# Patient Record
Sex: Male | Born: 1950 | Race: White | Hispanic: No | Marital: Married | Smoking: Former smoker
Health system: Southern US, Community
[De-identification: ages and names within clinical notes are randomized; demographics above are authoritative.]

## PROBLEM LIST (undated history)

## (undated) DIAGNOSIS — R9389 Abnormal findings on diagnostic imaging of other specified body structures: Secondary | ICD-10-CM

## (undated) DIAGNOSIS — C801 Malignant (primary) neoplasm, unspecified: Secondary | ICD-10-CM

## (undated) DIAGNOSIS — Z860100 Personal history of colon polyps, unspecified: Secondary | ICD-10-CM

## (undated) DIAGNOSIS — E785 Hyperlipidemia, unspecified: Secondary | ICD-10-CM

## (undated) DIAGNOSIS — K219 Gastro-esophageal reflux disease without esophagitis: Secondary | ICD-10-CM

## (undated) DIAGNOSIS — M1990 Unspecified osteoarthritis, unspecified site: Secondary | ICD-10-CM

## (undated) DIAGNOSIS — D329 Benign neoplasm of meninges, unspecified: Secondary | ICD-10-CM

## (undated) DIAGNOSIS — R972 Elevated prostate specific antigen [PSA]: Secondary | ICD-10-CM

## (undated) DIAGNOSIS — K5730 Diverticulosis of large intestine without perforation or abscess without bleeding: Secondary | ICD-10-CM

## (undated) DIAGNOSIS — R0789 Other chest pain: Secondary | ICD-10-CM

## (undated) DIAGNOSIS — J329 Chronic sinusitis, unspecified: Secondary | ICD-10-CM

## (undated) HISTORY — DX: Diverticulosis of large intestine without perforation or abscess without bleeding: K57.30

## (undated) HISTORY — DX: Unspecified osteoarthritis, unspecified site: M19.90

## (undated) HISTORY — DX: Chronic sinusitis, unspecified: J32.9

## (undated) HISTORY — DX: Hyperlipidemia, unspecified: E78.5

## (undated) HISTORY — DX: Abnormal findings on diagnostic imaging of other specified body structures: R93.89

## (undated) HISTORY — DX: Benign neoplasm of meninges, unspecified: D32.9

## (undated) HISTORY — DX: Gastro-esophageal reflux disease without esophagitis: K21.9

## (undated) HISTORY — DX: Malignant (primary) neoplasm, unspecified: C80.1

## (undated) HISTORY — PX: POLYPECTOMY: SHX149

## (undated) HISTORY — DX: Personal history of colon polyps, unspecified: Z86.0100

## (undated) HISTORY — DX: Other chest pain: R07.89

## (undated) HISTORY — DX: Elevated prostate specific antigen (PSA): R97.20

## (undated) HISTORY — PX: COLONOSCOPY: SHX174

## (undated) HISTORY — PX: TONSILLECTOMY AND ADENOIDECTOMY: SUR1326

## (undated) HISTORY — PX: REFRACTIVE SURGERY: SHX103

## (undated) HISTORY — DX: Personal history of colonic polyps: Z86.010

---

## 2000-07-29 ENCOUNTER — Encounter: Admission: RE | Admit: 2000-07-29 | Discharge: 2000-07-29 | Payer: Self-pay | Admitting: Sports Medicine

## 2000-07-29 ENCOUNTER — Encounter: Payer: Self-pay | Admitting: Sports Medicine

## 2000-09-16 ENCOUNTER — Encounter: Admission: RE | Admit: 2000-09-16 | Discharge: 2000-09-16 | Payer: Self-pay | Admitting: Family Medicine

## 2001-10-21 ENCOUNTER — Encounter: Admission: RE | Admit: 2001-10-21 | Discharge: 2001-10-21 | Payer: Self-pay | Admitting: Sports Medicine

## 2001-11-05 HISTORY — PX: OTHER SURGICAL HISTORY: SHX169

## 2001-12-25 ENCOUNTER — Encounter (INDEPENDENT_AMBULATORY_CARE_PROVIDER_SITE_OTHER): Payer: Self-pay

## 2001-12-25 ENCOUNTER — Inpatient Hospital Stay (HOSPITAL_COMMUNITY): Admission: RE | Admit: 2001-12-25 | Discharge: 2001-12-28 | Payer: Self-pay | Admitting: Orthopaedic Surgery

## 2001-12-25 ENCOUNTER — Encounter: Payer: Self-pay | Admitting: Orthopaedic Surgery

## 2003-05-19 ENCOUNTER — Encounter: Payer: Self-pay | Admitting: Internal Medicine

## 2003-05-19 DIAGNOSIS — Z860100 Personal history of colon polyps, unspecified: Secondary | ICD-10-CM | POA: Insufficient documentation

## 2003-10-05 ENCOUNTER — Encounter: Admission: RE | Admit: 2003-10-05 | Discharge: 2003-10-05 | Payer: Self-pay | Admitting: Sports Medicine

## 2003-11-02 ENCOUNTER — Encounter: Admission: RE | Admit: 2003-11-02 | Discharge: 2003-11-02 | Payer: Self-pay | Admitting: Sports Medicine

## 2004-08-29 ENCOUNTER — Ambulatory Visit: Payer: Self-pay | Admitting: Sports Medicine

## 2005-05-29 ENCOUNTER — Ambulatory Visit: Payer: Self-pay | Admitting: Sports Medicine

## 2005-10-09 ENCOUNTER — Ambulatory Visit: Payer: Self-pay | Admitting: Internal Medicine

## 2005-10-09 ENCOUNTER — Ambulatory Visit: Payer: Self-pay | Admitting: Pulmonary Disease

## 2005-10-18 ENCOUNTER — Ambulatory Visit: Payer: Self-pay | Admitting: Internal Medicine

## 2006-10-15 ENCOUNTER — Ambulatory Visit: Payer: Self-pay | Admitting: Pulmonary Disease

## 2008-01-07 ENCOUNTER — Telehealth (INDEPENDENT_AMBULATORY_CARE_PROVIDER_SITE_OTHER): Payer: Self-pay | Admitting: *Deleted

## 2008-01-15 ENCOUNTER — Encounter: Payer: Self-pay | Admitting: Pulmonary Disease

## 2008-03-24 ENCOUNTER — Ambulatory Visit: Payer: Self-pay | Admitting: Pulmonary Disease

## 2008-03-24 DIAGNOSIS — M1990 Unspecified osteoarthritis, unspecified site: Secondary | ICD-10-CM | POA: Insufficient documentation

## 2008-03-24 DIAGNOSIS — J329 Chronic sinusitis, unspecified: Secondary | ICD-10-CM | POA: Insufficient documentation

## 2008-03-24 DIAGNOSIS — R972 Elevated prostate specific antigen [PSA]: Secondary | ICD-10-CM

## 2008-03-24 DIAGNOSIS — R0789 Other chest pain: Secondary | ICD-10-CM | POA: Insufficient documentation

## 2008-03-24 DIAGNOSIS — E785 Hyperlipidemia, unspecified: Secondary | ICD-10-CM

## 2008-11-05 HISTORY — PX: OTHER SURGICAL HISTORY: SHX169

## 2009-08-18 ENCOUNTER — Ambulatory Visit: Payer: Self-pay | Admitting: Sports Medicine

## 2009-08-18 DIAGNOSIS — M25569 Pain in unspecified knee: Secondary | ICD-10-CM

## 2009-08-18 DIAGNOSIS — M7660 Achilles tendinitis, unspecified leg: Secondary | ICD-10-CM | POA: Insufficient documentation

## 2009-10-11 ENCOUNTER — Telehealth (INDEPENDENT_AMBULATORY_CARE_PROVIDER_SITE_OTHER): Payer: Self-pay | Admitting: *Deleted

## 2009-10-13 ENCOUNTER — Ambulatory Visit: Payer: Self-pay | Admitting: Pulmonary Disease

## 2009-10-13 DIAGNOSIS — D320 Benign neoplasm of cerebral meninges: Secondary | ICD-10-CM

## 2009-10-13 LAB — CONVERTED CEMR LAB
ALT: 40 units/L (ref 0–53)
Basophils Absolute: 0 10*3/uL (ref 0.0–0.1)
Bilirubin, Direct: 0 mg/dL (ref 0.0–0.3)
CO2: 28 meq/L (ref 19–32)
Chloride: 107 meq/L (ref 96–112)
Cholesterol: 170 mg/dL (ref 0–200)
Direct LDL: 76.4 mg/dL
Eosinophils Relative: 3.1 % (ref 0.0–5.0)
HDL: 30.6 mg/dL — ABNORMAL LOW (ref >39.00)
Ketones, ur: NEGATIVE mg/dL
Leukocytes, UA: NEGATIVE
MCV: 95.6 fL (ref 78.0–100.0)
Monocytes Absolute: 0.7 10*3/uL (ref 0.1–1.0)
Monocytes Relative: 10.5 % (ref 3.0–12.0)
Neutrophils Relative %: 56.3 % (ref 43.0–77.0)
Nitrite: NEGATIVE
PSA: 5.05 ng/mL — ABNORMAL HIGH (ref 0.10–4.00)
Platelets: 168 10*3/uL (ref 150.0–400.0)
RDW: 12.1 % (ref 11.5–14.6)
Sodium: 141 meq/L (ref 135–145)
Specific Gravity, Urine: >=1.03 (ref 1.000–1.030)
Total Bilirubin: 0.8 mg/dL (ref 0.3–1.2)
Total CHOL/HDL Ratio: 6
Triglycerides: 303 mg/dL — ABNORMAL HIGH (ref 0.0–149.0)
Urobilinogen, UA: 0.2 (ref 0.0–1.0)
WBC: 7.1 10*3/uL (ref 4.5–10.5)
pH: 5 (ref 5.0–8.0)

## 2009-11-05 HISTORY — PX: OTHER SURGICAL HISTORY: SHX169

## 2010-05-19 ENCOUNTER — Encounter (INDEPENDENT_AMBULATORY_CARE_PROVIDER_SITE_OTHER): Payer: Self-pay | Admitting: *Deleted

## 2010-05-19 ENCOUNTER — Telehealth (INDEPENDENT_AMBULATORY_CARE_PROVIDER_SITE_OTHER): Payer: Self-pay

## 2010-06-13 ENCOUNTER — Encounter (INDEPENDENT_AMBULATORY_CARE_PROVIDER_SITE_OTHER): Payer: Self-pay | Admitting: *Deleted

## 2010-06-15 ENCOUNTER — Ambulatory Visit: Payer: Self-pay | Admitting: Internal Medicine

## 2010-06-15 ENCOUNTER — Ambulatory Visit: Payer: Self-pay | Admitting: Sports Medicine

## 2010-07-05 ENCOUNTER — Telehealth: Payer: Self-pay | Admitting: Internal Medicine

## 2010-07-05 ENCOUNTER — Telehealth (INDEPENDENT_AMBULATORY_CARE_PROVIDER_SITE_OTHER): Payer: Self-pay | Admitting: *Deleted

## 2010-08-24 ENCOUNTER — Ambulatory Visit: Payer: Self-pay | Admitting: Internal Medicine

## 2010-08-29 ENCOUNTER — Encounter: Payer: Self-pay | Admitting: Internal Medicine

## 2010-11-07 ENCOUNTER — Encounter: Payer: Self-pay | Admitting: Internal Medicine

## 2010-11-17 ENCOUNTER — Ambulatory Visit
Admission: RE | Admit: 2010-11-17 | Discharge: 2010-11-17 | Payer: Self-pay | Source: Home / Self Care | Attending: Pulmonary Disease | Admitting: Pulmonary Disease

## 2010-11-17 ENCOUNTER — Other Ambulatory Visit: Payer: Self-pay | Admitting: Pulmonary Disease

## 2010-11-17 LAB — CBC WITH DIFFERENTIAL/PLATELET
Basophils Absolute: 0 10*3/uL (ref 0.0–0.1)
Basophils Relative: 0.5 % (ref 0.0–3.0)
Eosinophils Absolute: 0.2 10*3/uL (ref 0.0–0.7)
Eosinophils Relative: 2.2 % (ref 0.0–5.0)
HCT: 43.6 % (ref 39.0–52.0)
Hemoglobin: 15.1 g/dL (ref 13.0–17.0)
Lymphocytes Relative: 24.5 % (ref 12.0–46.0)
Lymphs Abs: 1.9 10*3/uL (ref 0.7–4.0)
MCHC: 34.8 g/dL (ref 30.0–36.0)
MCV: 94 fl (ref 78.0–100.0)
Monocytes Absolute: 0.7 10*3/uL (ref 0.1–1.0)
Monocytes Relative: 8.4 % (ref 3.0–12.0)
Neutro Abs: 5 10*3/uL (ref 1.4–7.7)
Neutrophils Relative %: 64.4 % (ref 43.0–77.0)
Platelets: 212 10*3/uL (ref 150.0–400.0)
RBC: 4.64 Mil/uL (ref 4.22–5.81)
RDW: 12.9 % (ref 11.5–14.6)
WBC: 7.8 10*3/uL (ref 4.5–10.5)

## 2010-11-17 LAB — BASIC METABOLIC PANEL
BUN: 18 mg/dL (ref 6–23)
CO2: 30 mEq/L (ref 19–32)
Calcium: 9.4 mg/dL (ref 8.4–10.5)
Chloride: 107 mEq/L (ref 96–112)
Creatinine, Ser: 1.3 mg/dL (ref 0.4–1.5)
GFR: 60.77 mL/min (ref >60.00)
Glucose, Bld: 94 mg/dL (ref 70–99)
Potassium: 4.5 mEq/L (ref 3.5–5.1)
Sodium: 143 mEq/L (ref 135–145)

## 2010-11-17 LAB — HEPATIC FUNCTION PANEL
ALT: 21 U/L (ref 0–53)
AST: 23 U/L (ref 0–37)
Albumin: 4.1 g/dL (ref 3.5–5.2)
Alkaline Phosphatase: 52 U/L (ref 39–117)
Bilirubin, Direct: 0.1 mg/dL (ref 0.0–0.3)
Total Bilirubin: 0.5 mg/dL (ref 0.3–1.2)
Total Protein: 6.8 g/dL (ref 6.0–8.3)

## 2010-11-17 LAB — LIPID PANEL
Cholesterol: 115 mg/dL (ref 0–200)
HDL: 29.3 mg/dL — ABNORMAL LOW (ref >39.00)
Total CHOL/HDL Ratio: 4
Triglycerides: 220 mg/dL — ABNORMAL HIGH (ref 0.0–149.0)
VLDL: 44 mg/dL — ABNORMAL HIGH (ref 0.0–40.0)

## 2010-11-17 LAB — TSH: TSH: 1.28 u[IU]/mL (ref 0.35–5.50)

## 2010-11-17 LAB — LDL CHOLESTEROL, DIRECT: Direct LDL: 57.3 mg/dL

## 2010-11-30 ENCOUNTER — Telehealth (INDEPENDENT_AMBULATORY_CARE_PROVIDER_SITE_OTHER): Payer: Self-pay | Admitting: *Deleted

## 2010-12-03 LAB — CONVERTED CEMR LAB
Bilirubin, Direct: 0.1 mg/dL (ref 0.0–0.3)
Calcium: 9.3 mg/dL (ref 8.4–10.5)
Cholesterol: 134 mg/dL (ref 0–200)
Crystals: NEGATIVE
GFR calc Af Amer: 80 mL/min
GFR calc non Af Amer: 66 mL/min
HCT: 45.5 % (ref 39.0–52.0)
HDL: 25.4 mg/dL — ABNORMAL LOW (ref >39.0)
Hemoglobin, Urine: NEGATIVE
Lymphocytes Relative: 24.9 % (ref 12.0–46.0)
Monocytes Absolute: 0.7 10*3/uL (ref 0.1–1.0)
Monocytes Relative: 10 % (ref 3.0–12.0)
Nitrite: NEGATIVE
Platelets: 176 10*3/uL (ref 150–400)
RDW: 12.2 % (ref 11.5–14.6)
Sodium: 142 meq/L (ref 135–145)
Squamous Epithelial / LPF: NEGATIVE /lpf
TSH: 1.2 microintl units/mL (ref 0.35–5.50)
Total Bilirubin: 1.1 mg/dL (ref 0.3–1.2)
Total CHOL/HDL Ratio: 5.3
Total Protein, Urine: NEGATIVE mg/dL
Triglycerides: 203 mg/dL (ref 0–149)
VLDL: 41 mg/dL — ABNORMAL HIGH (ref 0–40)

## 2010-12-05 NOTE — Miscellaneous (Signed)
Summary: [facility] Previsit/prep  Clinical Lists Changes  Medications: Added new medication of MOVIPREP 100 GM  SOLR (PEG-KCL-NACL-NASULF-NA ASC-C) As per prep instructions. - Signed Rx of MOVIPREP 100 GM  SOLR (PEG-KCL-NACL-NASULF-NA ASC-C) As per prep instructions.;  #1 x 0;  Signed;  Entered by: Wyona Almas RN;  Authorized by: Iva Boop MD, [facility];  Method used: Print then Give to Patient Observations: Added new observation of ALLERGY REV: Done (06/15/2010 14:11)    Prescriptions: MOVIPREP 100 GM  SOLR (PEG-KCL-NACL-NASULF-NA ASC-C) As per prep instructions.  #1 x 0   Entered by:   Wyona Almas RN   Authorized by:   Iva Boop MD, [facility]   Signed by:   Wyona Almas RN on 06/15/2010   Method used:   Print then Give to Patient   RxID:   [id number]

## 2010-12-05 NOTE — Procedures (Signed)
Summary: Colonoscopy   Colonoscopy  Procedure date:  10/28/2003  Findings:      Location:  [facility].   Patient Name: Jimmy Lamb, Jimmy Lamb. MRN:  Procedure Procedures: Colonoscopy CPT: [phone].  Personnel: Endoscopist: Iva Boop, MD, [facility].  Exam Location: Exam performed in Outpatient Clinic. Outpatient  Patient Consent: Procedure, Alternatives, Risks and Benefits discussed, consent obtained, from patient. Consent was obtained by the RN.  Indications  Surveillance of: Adenomatous Polyp(s). This is an initial surveillance exam. Initial polypectomy was performed in 2004. in Jul. 3 or more Polyps were found at Index Exam. Largest polyp removed was 6 to 9 mm. Prior polyp located in proximal (splenic flexure and beyond) colon. Pathology of worst  polyp: tubular adenoma.  History  Current Medications: Patient is not currently taking Coumadin.  Allergies: Allergic to Penicillin.  Pre-Exam Physical: Performed Oct 18, 2005. Cardio-pulmonary exam, Rectal exam, HEENT exam , Abdominal exam, Mental status exam WNL.  Comments: Patient history reviewed and updated, pre-procedure physical performed prior to initiation of sedation? Yes Exam Exam: Extent of exam reached: Cecum, extent intended: Cecum.  The cecum was identified by appendiceal orifice and IC valve. Patient position: on left side. Colon retroflexion performed. Images taken. ASA Classification: II. Tolerance: excellent.  Monitoring: Pulse and BP monitoring, Oximetry used. Supplemental O2 given.  Colon Prep Used OsmoPrep for colon prep. Prep results: good.  Sedation Meds: Patient assessed and found to be appropriate for moderate (conscious) sedation. Fentanyl 50 mcg. given IV. Versed 5 mg. given IV.  Findings - DIVERTICULOSIS: Descending Colon to Sigmoid Colon. ICD9: Diverticulosis, Colon: 562.10.  - NORMAL EXAM: Cecum to Splenic Flexure.  HEMORRHOIDS: Internal. Size: Grade I. ICD9: Hemorrhoids,  Internal: 455.0.   Assessment  Diagnoses: 562.10: Diverticulosis, Colon.  455.0: Hemorrhoids, Internal.   Comments: NO POLYPS OR CANCER SEEN Events  Unplanned Interventions: No intervention was required.  Plans Patient Education: Patient given standard instructions for: Diverticulosis. Hemorrhoids.  Disposition: After procedure patient sent to recovery. After recovery patient sent home.  Scheduling/Referral: Colonoscopy, to Iva Boop, MD, [facility], 5 years,  Primary Care Provider, to [facility]. Kriste Basque, MD, re: elevated PSA,   Comments: Will send chart back to Dr. Kriste Basque. Recent labs showing PSA of 4.22 on chart and may not have been seen yet. Prostate is normal.   This report was created from the original endoscopy report, which was reviewed and signed by the above listed endoscopist.

## 2010-12-05 NOTE — Procedures (Signed)
Summary: Colonoscopy  Patient: Jimmy Lamb Note: All result statuses are Final unless otherwise noted.  Tests: (1) Colonoscopy (COL)   COL Colonoscopy           DONE     [facility]     [address]     [place], [place]  [address]           COLONOSCOPY PROCEDURE REPORT           PATIENT:  Jimmy Lamb, Jimmy Lamb  MR#:  [id number]     BIRTHDATE:  07/01/1951, 59 yrs. old  GENDER:  male     ENDOSCOPIST:  Iva Boop, MD, [facility]           PROCEDURE DATE:  08/24/2010     PROCEDURE:  Colonoscopy with biopsy and snare polypectomy     ASA CLASS:  Class II     INDICATIONS:  surveillance and high-risk screening, history of     pre-cancerous (adenomatous) colon polyps 3 adenomas in 2004     none in 2006     MEDICATIONS:   Fentanyl 75 mcg, Versed 8 mg IV           DESCRIPTION OF PROCEDURE:   After the risks benefits and     alternatives of the procedure were thoroughly explained, informed     consent was obtained.  Digital rectal exam was performed and     revealed no abnormalities and normal prostate.   The LB CF-[id number]     [id number] endoscope was introduced through the anus and advanced to     the cecum, which was identified by both the appendix and ileocecal     valve, without limitations.  The quality of the prep was     excellent, using MoviPrep.  The instrument was then slowly     withdrawn as the colon was fully examined. Insertion: 5:15 minutes     Withdrawal: 13:04 minutes     <<PROCEDUREIMAGES>>           FINDINGS:  Three polyps were found. All diminutive, removed from     rectum, descending and cecum. The cecal  polyp was removed using     cold biopsy forceps. the other polyps were snared without cautery.     Retrieval was successful. Moderate diverticulosis was found in the     sigmoid colon.  This was otherwise a normal examination of the     colon.   Retroflexed views in the rectum revealed no     abnormalities.    The scope was then withdrawn from the patient     and  the procedure completed.           COMPLICATIONS:  None     ENDOSCOPIC IMPRESSION:     1) Three polyps removed from the colon, all diminutive (5mm or     smaller)     2) Moderate diverticulosis in the sigmoid colon     3) Otherwise normal examination, excellent prep     4) Personal history of 3 tubular adenomas, maximum 8mm, 2004           REPEAT EXAM:  In for Colonoscopy, pending biopsy results.           Iva Boop, MD, [facility]           CC:  The Patient           n.     eSIGNED:   Iva Boop at 08/24/2010 10:41 AM  Jimmy Lamb, Jimmy Lamb, [id number]  Note: An exclamation mark (!) indicates a result that was not dispersed into the flowsheet. Document Creation Date: 08/24/2010 10:42 AM _______________________________________________________________________  (1) Order result status: Final Collection or observation date-time: 08/24/2010 10:31 Requested date-time:  Receipt date-time:  Reported date-time:  Referring Physician:   Ordering Physician: Stan Head [phone]) Specimen Source:  Source: Launa Grill Order Number: [id number] Lab site:   Appended Document: Colonoscopy     Procedures Next Due Date:    Colonoscopy: 08/2015

## 2010-12-05 NOTE — Letter (Signed)
Summary: Patient Notice- Polyp Results  [facility]  [address] [place], [place] [address]   Phone: [phone]  Fax: [phone]        August 29, 2010 MRN: [id number]    [facility] [address] [place], [place]  [address]    Dear Jimmy Lamb,  The polyps removed from your colon were adenomatous. This means that they were pre-cancerous or that  they had the potential to change into cancer over time.  I recommend that you have a repeat colonoscopy in 5 years to determine if you have developed any new polyps over time and to screen for colorectal cancer. If you develop any new rectal bleeding, abdominal pain or significant bowel habit changes, please contact us before then.   In addition to repeating colonoscopy, changing health habits may reduce your risk of having more colon polyps and possibly, colon cancer. You may lower your risk of future polyps and colon cancer by adopting healthy habits such as not smoking or using tobacco (if you do), being physically active, losing weight (if overweight), and eating a diet which includes fruits and vegetables and limits red meat.  Please call us if you are having persistent problems or have questions about your condition that have not been fully answered at this time.  Sincerely,  Iva Boop MD, [facility]  This letter has been electronically signed by your physician.  Appended Document: Patient Notice- Polyp Results letter mailed 10.28.11

## 2010-12-05 NOTE — Progress Notes (Signed)
Summary: Colon recall date?  Phone Note Call from Patient Call back at Home Phone [phone]   Caller: Patient Call For: Leone Payor Summary of Call: Patient's wife would like patient 's chart reviewed to see if he is due for a colon.  According to the system he is due 10/2010.  Patient  wants to try and get colon done earlier than Dec.  Last colon 10/2005, no polyps, but he does have a hx of adenomatous polops from 04?  Please see path and colon reports and advise when next colon is due. Initial call taken by: Darcey Nora RN, CGRN,  May 19, 2010 9:27 AM  Follow-up for Phone Call        ok to do colonoscopy anytime now Follow-up by: Iva Boop MD, [facility],  May 19, 2010 3:10 PM  Additional Follow-up for Phone Call Additional follow up Details #1::        patient aware, they need to check with their schedule and will call back to schedule Additional Follow-up by: Darcey Nora RN, CGRN,  May 19, 2010 3:17 PM

## 2010-12-05 NOTE — Procedures (Signed)
Summary: Colonoscopy   Colonoscopy  Procedure date:  05/19/2003  Findings:      Location:  [facility].   Patient Name: Jimmy Lamb, Jimmy Lamb. MRN:  Procedure Procedures: Colonoscopy CPT: [id number].    with polypectomy. CPT: [id number].  Personnel: Endoscopist: Iva Boop, MD, [facility] [place] [facility].  Referred By: [name], MD.  Exam Location: Exam performed in Outpatient Clinic. Outpatient  Patient Consent: Procedure, Alternatives, Risks and Benefits discussed, consent obtained, from patient. Consent was obtained by the RN.  Indications  Average Risk Screening Routine.  History  Pre-Exam Physical: Performed May 19, 2003. Cardio-pulmonary exam, Rectal exam, HEENT exam , Abdominal exam, Mental status exam WNL.  Exam Exam: Extent of exam reached: Cecum, extent intended: Cecum.  The cecum was identified by appendiceal orifice and IC valve. Patient position: left side to back. Colon retroflexion performed. Images taken. ASA Classification: II. Tolerance: excellent.  Monitoring: Pulse and BP monitoring, Oximetry used. Supplemental O2 given.  Colon Prep Used Visicol for colon prep. Prep results: good.  Sedation Meds: Patient assessed and found to be appropriate for moderate (conscious) sedation. Sedation was managed by the Endoscopist. Fentanyl 50 mcg. given IV. Versed 6 mg. given IV.  Findings - NORMAL EXAM: Hepatic Flexure to Descending Colon.  POLYP: Ascending Colon, Maximum size: 8 mm. sessile polyp. Procedure:  snare with cautery, removed, retrieved, Polyp sent to pathology. 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  - DIVERTICULOSIS: Descending Colon to Sigmoid Colon. Not bleeding. ICD9: Diverticulosis, Colon: 562.10. Comments: Mild-Moderate.  NORMAL EXAM: Cecum.  MULTIPLE POLYPS: Sigmoid Colon. minimum size 2 mm, maximum size 5 mm. Procedure:  snare without cautery, removed, retrieved, 3 polyps Polyps sent to pathology. ICD9: Neoplasia, Benign, Large Bowel:  211. 3.  POLYP: Rectum, Maximum size: 4 mm. diminutive, sessile polyp. Procedure:  biopsy without cautery, removed, retrieved, Polyp sent to pathology. ICD9: Neoplasia, Benign, Rectum: 211.4.   Assessment Abnormal examination, see findings above.  Diagnoses: 211.3: Neoplasia, Benign, Large Bowel.  562.10: Diverticulosis, Colon.  211.4: Neoplasia, Benign, Rectum.   Comments: 5 POLYPS REMOVED DIVERTICULOSIS  Events  Unplanned Interventions: No intervention was required.  Plans Patient Education: Patient given standard instructions for: Polyps. Diverticulosis.  Disposition: After procedure patient sent to recovery. After recovery patient sent home.  Scheduling/Referral: Colonoscopy, to Iva Boop, MD, [facility], 2-3 YEARS,    This report was created from the original endoscopy report, which was reviewed and signed by the above listed endoscopist.

## 2010-12-05 NOTE — Progress Notes (Signed)
Summary: Procedure this fri 07-07-10  Phone Note Call from Patient Call back at Home Phone [phone]   Caller: Spouse Call For: Dr Leone Payor Reason for Call: Talk to Nurse Summary of Call: Feels sinus problems coming on and wonders if he should rescHedule his procedure? Initial call taken by: Leanor Kail [facility],  July 05, 2010 9:24 AM  Follow-up for Phone Call        Pt denies fever, chills, cough or "feeling bad"  Instructed pt to call and cancel his procedure before he begins prep if he has any of the above mentioned symptoms. Follow-up by: Ezra Sites RN,  July 05, 2010 12:04 PM

## 2010-12-05 NOTE — Progress Notes (Signed)
Summary: cx proc  Phone Note Call from Patient Call back at Home Phone [phone]   Caller: Patient Call For: Dr Leone Payor Reason for Call: Talk to Nurse Summary of Call: Patient cancelled his procedure for Friday because he has not been feeling good and now is running a fever. Patient rescheduled for next available morning 08-24-2010.  will you charge?  Initial call taken by: Tawni Levy,  July 05, 2010 2:52 PM  Follow-up for Phone Call        No Follow-up by: Iva Boop MD, [facility],  July 06, 2010 8:39 AM  Additional Follow-up for Phone Call Additional follow up Details #1::        Patient NOT BILLED. Additional Follow-up by: Leanor Kail [facility],  July 11, 2010 2:05 PM

## 2010-12-05 NOTE — Letter (Signed)
Summary: Previsit letter  [facility]  [address] [place], [place] [address]   Phone: [phone]  Fax: [phone]       05/19/2010 MRN: [id number]  [facility] [address] [place], [place]  [address]  Dear Mr. Chestine Spore,  Welcome to the Gastroenterology Division at [facility].    You are scheduled to see a nurse for your pre-procedure visit on 06-23-10 at 4:30p.m. on the 3rd floor at [facility], [address]  We ask that you try to arrive at our office 15 minutes prior to your appointment time to allow for check-in.  Your nurse visit will consist of discussing your medical and surgical history, your immediate family medical history, and your medications.    Please bring a complete list of all your medications or, if you prefer, bring the medication bottles and we will list them.  We will need to be aware of both prescribed and over the counter drugs.  We will need to know exact dosage information as well.  If you are on blood thinners (Coumadin, Plavix, Aggrenox, Ticlid, etc.) please call our office today/prior to your appointment, as we need to consult with your physician about holding your medication.   Please be prepared to read and sign documents such as consent forms, a financial agreement, and acknowledgement forms.  If necessary, and with your consent, a friend or relative is welcome to sit-in on the nurse visit with you.  Please bring your insurance card so that we may make a copy of it.  If your insurance requires a referral to see a specialist, please bring your referral form from your primary care physician.  No co-pay is required for this nurse visit.     If you cannot keep your appointment, please call [phone] to cancel or reschedule prior to your appointment date.  This allows [place] the opportunity to schedule an appointment for another patient in need of care.    Thank you for choosing [facility] for your medical  needs.  We appreciate the opportunity to care for you.  Please visit [place] at our website  to learn more about our practice.                     Sincerely.                                                                                                                   The Gastroenterology Division

## 2010-12-05 NOTE — Assessment & Plan Note (Signed)
Summary: F/U ACHILLIES,MC   Vital Signs:  Patient profile:   60 year old male BP sitting:   135 / 87  Vitals Entered By: Lillia Pauls CMA (June 15, 2010 3:25 PM)  History of Present Illness: Jimmy Lamb comes back for follow of Left AT this was seen last year w a large nodule lots of neovessels pain to touch and to actiivity  states he has done exercises most days but usually once daily used voltaren gel and that helped pain since he ran out of voltaren a couple of months ago - no real pain  still feels nodule but with no pain would like to get back to running  Allergies: 1)  ! Pcn  Physical Exam  General:  Well-developed,well-nourished,in no acute distress; alert,appropriate and cooperative throughout examination Msk:  RT AT feels normal there is some thickening but no real nodularity noted in AT at 2 cm above heel  LT AT still has a large nodule 2 cms above calcaneus insert no TTP no crepitation or swelling nontender at insertion and this seems normal size Additional Exam:  MSK [place]  Compared to scans of 08/2009 The LT AT looks normal Thickness AP is 0.55 just above insertion thickest portion is 0.72 at 2 cm above heel  RT AT now has resolution of all neovessels and norm doppler flow now Nodule is down from 1.1 to 0.82 cms insertin to heel is 0.52 cms  images saved   Impression & Recommendations:  Problem # 1:  ACHILLES TENDINITIS, BILATERAL (ICD-726.71)  really has no sxs now RT appears normal and back to baseline left is much improved but still has moderately large nodule tendon looks good though and is not painful  Keep up exercises once daily OK to begin easy walk/jog stay at easy level x 1 mo IF no real pain then gradually increase  we cna reck as needed  Orders: [place] LIMITED ([id number])  Complete Medication List: 1)  Adult Aspirin Low Strength 81 Mg Tbdp (Aspirin) .... Take 1 tablet by mouth once a day 2)  Niacin 250 Mg Tabs (Niacin) .... Take as  directed... 3)  Voltaren 1 % Gel (Diclofenac sodium) .... Use 1 gram to each achilles tendon qid 4)  Gnp Ibuprofen 200 Mg Tabs (Ibuprofen) .... Take 2 tablets by mouth two times a day 5)  Multivitamins Tabs (Multiple vitamin) .... Take 1 tablet by mouth once a day 6)  Fenofibrate 160 Mg Tabs (Fenofibrate) .... Take 1 tablet by mouth once a day 7)  Moviprep 100 Gm Solr (Peg-kcl-nacl-nasulf-na asc-c) .... As per prep instructions.

## 2010-12-05 NOTE — Letter (Signed)
Summary: Moviprep Instructions  [facility]  [address]   [place], [place] [address]   Phone: [phone]  Fax: [phone]       Jimmy Lamb    11-Dec-1950    MRN: [id number]        Procedure Day /Date:  Friday   07-07-10     Arrival Time: 9:30 a.m.      Procedure Time: 10:30 a.m.     Location of Procedure:                    x   [facility] (4th Floor)                        PREPARATION FOR COLONOSCOPY WITH MOVIPREP   Starting 5 days prior to your procedure 07-02-10 do not eat nuts, seeds, popcorn, corn, beans, peas,  salads, or any raw vegetables.  Do not take any fiber supplements (e.g. Metamucil, Citrucel, and Benefiber).  THE DAY BEFORE YOUR PROCEDURE         DATE: 07-06-10  DAY: Thursday  1.  Drink clear liquids the entire day-NO SOLID FOOD  2.  Do not drink anything colored red or purple.  Avoid juices with pulp.  No orange juice.  3.  Drink at least 64 oz. (8 glasses) of fluid/clear liquids during the day to prevent dehydration and help the prep work efficiently.  CLEAR LIQUIDS INCLUDE: Water Jello Ice Popsicles Tea (sugar ok, no milk/cream) Powdered fruit flavored drinks Coffee (sugar ok, no milk/cream) Gatorade Juice: apple, white grape, white cranberry  Lemonade Clear bullion, consomm, broth Carbonated beverages (any kind) Strained chicken noodle soup Hard Candy                             4.  In the morning, mix first dose of MoviPrep solution:    Empty 1 Pouch A and 1 Pouch B into the disposable container    Add lukewarm drinking water to the top line of the container. Mix to dissolve    Refrigerate (mixed solution should be used within 24 hrs)  5.  Begin drinking the prep at 5:00 p.m. The MoviPrep container is divided by 4 marks.   Every 15 minutes drink the solution down to the next mark (approximately 8 oz) until the full liter is complete.   6.  Follow completed prep with 16 oz of clear liquid of your choice  (Nothing red or purple).  Continue to drink clear liquids until bedtime.  7.  Before going to bed, mix second dose of MoviPrep solution:    Empty 1 Pouch A and 1 Pouch B into the disposable container    Add lukewarm drinking water to the top line of the container. Mix to dissolve    Refrigerate  THE DAY OF YOUR PROCEDURE      DATE: 07-07-10   DAY: Friday  Beginning at  5:30 a.m.(5 hours before procedure):         1. Every 15 minutes, drink the solution down to the next mark (approx 8 oz) until the full liter is complete.  2. Follow completed prep with 16 oz. of clear liquid of your choice.    3. You may drink clear liquids until  8:30 a.m. (2 hours before procedure)   MEDICATION INSTRUCTIONS  Unless otherwise instructed, you should take regular prescription medications with a small sip of water  as early as possible the morning of your procedure.       OTHER INSTRUCTIONS  You will need a responsible adult at least 61 years of age to accompany you and drive you home.   This person must remain in the waiting room during your procedure.  Wear loose fitting clothing that is easily removed.  Leave jewelry and other valuables at home.  However, you may wish to bring a book to read or  an iPod/MP3 player to listen to music as you wait for your procedure to start.  Remove all body piercing jewelry and leave at home.  Total time from sign-in until discharge is approximately 2-3 hours.  You should go home directly after your procedure and rest.  You can resume normal activities the  day after your procedure.  The day of your procedure you should not:   Drive   Make legal decisions   Operate machinery   Drink alcohol   Return to work  You will receive specific instructions about eating, activities and medications before you leave.    The above instructions have been reviewed and explained to me by  Wyona Almas RN  June 15, 2010 2:49 PM     I fully  understand and can verbalize these instructions _____________________________ Date _________

## 2010-12-07 NOTE — Progress Notes (Signed)
Summary: results--lmomtcb x 1  Phone Note Call from Patient Call back at Work Phone [phone]   Caller: Patient Call For: nadel Summary of Call: pt wants results of labs and also want s a copy mailed to him. call him at work # (i have verified his mailing address-home address) Initial call taken by: Tivis Ringer, CNA,  November 30, 2010 10:52 AM  Follow-up for Phone Call        lmomtcb x 1. Zackery Barefoot CMA  November 30, 2010 12:06 PM  pt called back. just wants labs mailed to him. has alread reviewed these. no call back wanted per pt. Tivis Ringer, CNA  November 30, 2010 1:31 PM

## 2010-12-07 NOTE — Letter (Signed)
Summary: Colonoscopy Date Change Letter  [facility]  [address]   [place], [place] [address]   Phone: [phone]  Fax: [phone]      November 07, 2010 MRN: [id number]   [facility] [address] [place], [place]  [address]   Dear Jimmy Lamb,   Previously you were recommended to have a repeat colonoscopy around this time. Your chart was recently reviewed by Dr.Kristia Jupiter of [facility]. Follow up colonoscopy is now recommended in October 2016. This revised recommendation is based on current, nationally recognized guidelines for colorectal cancer screening and polyp surveillance. These guidelines are endorsed by the American Cancer Society, [employer] on Colorectal Cancer as well as numerous other major medical organizations.  Please understand that our recommendation assumes that you do not have any new symptoms such as bleeding, a change in bowel habits, anemia, or significant abdominal discomfort. If you do have any concerning GI symptoms or want to discuss the guideline recommendations, please call to arrange an office visit at your earliest convenience. Otherwise we will keep you in our reminder system and contact you 1-2 months prior to the date listed above to schedule your next colonoscopy.  Thank you,  Stan Head, M.D.  [facility] [phone]
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
## 2011-03-23 NOTE — Discharge Summary (Signed)
[place]. [facility]  Patient:    Jimmy Lamb, Jimmy Lamb Visit Number: [id number] MRN: [id number]          Service Type: SUR Location: 5000 5029 01 Attending Physician:  Randolm Idol Dictated by:   Arnoldo Morale, P.A. Admit Date:  12/25/2001 Discharge Date: 12/28/2001   CC:         Jimmy Lamb. Jimmy Lamb, M.D. [facility]   Discharge Summary  ADMISSION DIAGNOSES: 1. End stage osteoarthritis right hip. 2. ALLERGIES. 3. History of hepatitis A.  DISCHARGE DIAGNOSES: 1. End stage osteoarthritis right hip. 2. Acute blood loss anemia secondary to surgery. 3. Constipation. 4. ALLERGIES 5. History of hepatitis A.  SURGICAL PROCEDURE:  On 12/25/2001 the patient underwent a right total hip arthroplasty by Dr. Norlene Campbell, assisted by Dr. Jerolyn Shin. [place] and Karin Duffy, P.A.-C.  COMPLICATIONS: None.  CONSULTATIONS: 1. Pharmacy consultation for Coumadin therapy 12/25/2001. 2. Occupational therapy and physical therapy consultation 12/26/2001. 3. Case management consultation 12/28/2001.  HISTORY OF PRESENT ILLNESS:  This 60 year old white male patient presented to Dr. Cleophas Dunker with a three year history of progressively worsening right hip pain.  He had an injury to his knee about three years ago and the hip pain came on gradually.  The pain is intermittent dull ache in the buttock and trochanteric area with radiation into the right knee at times.  It increases with walking and dancing and decreases with biking or using the elliptical machine.  The patient complains of a rubbing sensation in the hip and he has failed preservative treatment.  He is presenting for right hip replacement.  HOSPITAL COURSE:  The patient tolerated his surgical procedure well without immediate postoperative complications.  He was transferred to 5000.  On postoperative day #1 the temperature maximum was 100.9, vital signs were stable.  Pain was well controlled with PCA.  Hemoglobin was 11.3,  hematocrit was 32.4. His PCA was discontinued and he was started on p.o. pain medications and continued on therapy.  He did extremely well over the next several days. He had some difficulty with constipation which was treated with minimal use of laxatives. His hemoglobin remained stable at 10.1 and 10.5 with his hematocrit at 28.5 and 29.7.  His pain was well controlled with minimal use of pain medications.  He was felt on 12/28/2001 to be doing well enough for discharge home and was discharged home later that day.  DISCHARGE INSTRUCTIONS: DIET: He can resume his regular prehospitalization diet.  MEDICATIONS: Can resume all prehospitalization medications except for his aspirin or ibuprofen.  Additional medications include: 1. Coumadin 1 tablet p.o. q.d., 6 mg once q.d. with pharmacy at [facility]    to adjust his dose. 2. Percocet 5 mg one to two p.o. q.4h p.r.n. pain #50 with no refill.  ACTIVITY:  He is to be out of bed partial weight-bearing 50% or less on the right leg with the use of the walker.  He is to arrange for home health physical therapy per [facility].  Home Health R.N. is to draw a CBC on 12/30/2001.  WOUND CARE:  Patient needs to keep his right hip incision clean and dry and may shower after there is no drainage from the wound for two days.  He is to notify Dr. Cleophas Dunker of any temperatures greater than 101.5, chills, pain unrelieved by the pain medications, or foul smelling drainage from the wound.  FOLLOW UP:  Patient is to follow up with Dr. Cleophas Dunker in our office on  March 5th and March 7th and is to call [phone] to set up those appointments.  LABORATORY DATA:  CHEST X-RAY on 12/25/2001 showed no active disease.  CBC: On 12/26/01 hemoglobin 11.3, hematocrit 32.4. On the 22nd the hemoglobin was 10.1 and hematocrit was 28.5, on 12/28/01 the hemoglobin was 10.5 and hematocrit was 29.7.  Platelet count on 12/27/01 was 148K.  On 12/26/01 the protime was 15.3  and INR was 1.3.  On 12/28/01 the protime was 19.1 and INR 1.9.  On 12/26/01 the potassium was 3.4.  All other laboratory studies were within normal limits. Dictated by:   Arnoldo Morale, P.A. Attending Physician:  Randolm Idol DD:  01/08/02 TD:  01/09/02 Job: [id number]

## 2011-03-23 NOTE — Op Note (Signed)
[place]. [facility]  Patient:    Jimmy Lamb, Jimmy Lamb Visit Number: [id number] MRN: [id number]          Service Type: SUR Location: 5000 5029 01 Attending Physician:  Randolm Idol Dictated by:   Claude Manges. Cleophas Dunker, M.D. Proc. Date: 12/25/01 Admit Date:  12/25/2001 Discharge Date: 12/28/2001                             Operative Report  PREOPERATIVE DIAGNOSIS:  End-stage osteoarthritis, right hip.  POSTOPERATIVE DIAGNOSIS:  End-stage osteoarthritis, right hip.  OPERATION PERFORMED:  Right total hip replacement.  SURGEON:  Claude Manges. Cleophas Dunker, M.D.  ASSISTANT: 1. Jerolyn Shin. Tresa Res, M.D. 2. Arnoldo Morale, P.A.  ANESTHESIA:  General orotracheal.  COMPLICATIONS:  None.  COMPONENTS:  Depuy Prodigy hip stem 15 diameter large stature.  The +1.5 mm neck and 28 mm outer diameter hip ball.  A 56 mm outer diameter Pinnacle series 100 acetabular cup, Apex hole eliminator and the Pinnacle Marathon acetabular liner.  28 mm inner diameter 10 degree posterior lip.  DESCRIPTION OF PROCEDURE:  With the patient comfortable on the operating table and under general orotracheal anesthesia, the patient was placed in the lateral decubitus position with the right side up.  The patient was secured to the operating room table with the Innomed hip system.  The right hip was then prepped from the iliac crest to below the knee with Betadine scrub and then DuraPrep.  Sterile draping was performed.  A Southern incision was utilized and by sharp dissection carried down through subcutaneous tissues.  Adipose tissue was incised with a Bovie and self-retaining retractor were inserted.  The iliotibial band was identified and incised along the length of the skin incision.  Self-retaining retractors were more deeply placed.  With the hip internally rotated, short external rotators were identified.  There was some difficulty with internal rotation because of the arthritic  deformity.  The short external rotators were then removed from their attachment to the posterior aspect of the greater trochanter.  The tendinous structures were tagged with 0 Tycron suture.  The capsule was identified and incised along the femoral neck and head.  There was very minimal clear joint effusion.  There was a moderate amount of synovitis which was then removed.  Using the AML calcar guide, osteotomy was made along the femoral neck about a fingerbreadth proximal to the lesser trochanter.  The head was then removed after incising the ligamentum.  The head was misshapened.  It was more ovoid than round and there was at least 50% loss of articular cartilage.  There were large osteophytes along the acetabulum.  These were removed after reaming the acetabulum.  A starter hole was then made in the trochanteric notch, a canal finder was inserted.  Reaming was performed to 14.5 to accept a 15 mm prosthesis.  Rasps were then inserted beginning with a 12 and then progressing to a 15 medium modified ____________ and then returned to a  13.5 for a standard and a 15 standard.  Calcar reamer was applied to obtain the appropriate angled calcar.  Retractors were then placed about the acetabulum.  The labrum was excised. There was a large amount of soft tissue within the center of the acetabulum which was removed with a rongeur and a Bovie.  Reaming was performed to 55 mm to accept a 56 mm outer diameter prosthesis. At that point, we had excellent circumferential bleeding.  We inserted the 100 series acetabular shell and what we felt was anatomic alignment.  We inserted the trial polyethylene liner.  We inserted the 15 mm rasp and then trialed several neck lengths.  We felt that the +1.5 re-established leg length deformity as he was about a half inch short preoperatively.  He had at the extremes of internal rotation and adduction and flexion he was unstable so we trialed a Prodigy stem and  felt that this was perfectly stable without dislocation at the extremes of flexion or extension.  The trials were removed.  We further impacted the acetabulum and felt we had a very stable construct.  The Apex hole eliminator was inserted followed by the Pinnacle Marathon acetabular liner with a 10 degree build up posteriorly.  I used a 15 mm large stature femoral component with the increased anteversion, i.e. Prodigy, impacted this flush on the calcar.  We then trialed a +1.5 mm neck with a 28 mm head and felt this was a very, very stable construct.  We then removed the trial head and applied the final 28 mm hip ball with a 1.5 mm neck length.  The entire construct was reduced and placed through a full range of motion with excellent stability.  The wound was irrigated with saline antibiotic solution throughout the operative procedure.  The capsule was closed anatomically with 0 Tycron.  The short external rotators were reapproximated anatomically with the same material.  The iliotibial band was closed with running 0 Vicryl.  Subcutaneous closed in two layers with 0 and 2-0 Vicryl.  Skin was closed with skin clips.  Sterile bulky dressing was applied.  The patient was then placed in the supine position, knee immobilizer applied and returned to post anesthesia care unit in satisfactory condition. Dictated by:   Claude Manges. Cleophas Dunker, M.D. Attending Physician:  Randolm Idol DD:  12/25/01 TD:  12/25/01 Job: [id number]

## 2011-12-31 ENCOUNTER — Other Ambulatory Visit (INDEPENDENT_AMBULATORY_CARE_PROVIDER_SITE_OTHER): Payer: BC Managed Care – PPO

## 2011-12-31 ENCOUNTER — Encounter: Payer: Self-pay | Admitting: Pulmonary Disease

## 2011-12-31 ENCOUNTER — Ambulatory Visit (INDEPENDENT_AMBULATORY_CARE_PROVIDER_SITE_OTHER): Payer: BC Managed Care – PPO | Admitting: Pulmonary Disease

## 2011-12-31 VITALS — BP 110/84 | HR 70 | Temp 97.1°F | Ht 68.5 in | Wt 241.0 lb

## 2011-12-31 DIAGNOSIS — E785 Hyperlipidemia, unspecified: Secondary | ICD-10-CM

## 2011-12-31 DIAGNOSIS — M1990 Unspecified osteoarthritis, unspecified site: Secondary | ICD-10-CM

## 2011-12-31 DIAGNOSIS — Z0000 Encounter for general adult medical examination without abnormal findings: Secondary | ICD-10-CM

## 2011-12-31 DIAGNOSIS — R972 Elevated prostate specific antigen [PSA]: Secondary | ICD-10-CM

## 2011-12-31 DIAGNOSIS — D320 Benign neoplasm of cerebral meninges: Secondary | ICD-10-CM

## 2011-12-31 DIAGNOSIS — D126 Benign neoplasm of colon, unspecified: Secondary | ICD-10-CM

## 2011-12-31 DIAGNOSIS — K5730 Diverticulosis of large intestine without perforation or abscess without bleeding: Secondary | ICD-10-CM

## 2011-12-31 LAB — HEPATIC FUNCTION PANEL
Albumin: 4.2 g/dL (ref 3.5–5.2)
Total Protein: 6.8 g/dL (ref 6.0–8.3)

## 2011-12-31 LAB — BASIC METABOLIC PANEL
BUN: 27 mg/dL — ABNORMAL HIGH (ref 6–23)
Calcium: 9.5 mg/dL (ref 8.4–10.5)
Creatinine, Ser: 1.3 mg/dL (ref 0.4–1.5)
GFR: 60.01 mL/min (ref >60.00)
Glucose, Bld: 95 mg/dL (ref 70–99)
Sodium: 141 mEq/L (ref 135–145)

## 2011-12-31 LAB — CBC WITH DIFFERENTIAL/PLATELET
Eosinophils Relative: 3.6 % (ref 0.0–5.0)
HCT: 46.3 % (ref 39.0–52.0)
Hemoglobin: 15.5 g/dL (ref 13.0–17.0)
Lymphocytes Relative: 28.5 % (ref 12.0–46.0)
Lymphs Abs: 1.7 10*3/uL (ref 0.7–4.0)
Monocytes Relative: 11 % (ref 3.0–12.0)
Neutro Abs: 3.3 10*3/uL (ref 1.4–7.7)
RBC: 4.93 Mil/uL (ref 4.22–5.81)
WBC: 5.9 10*3/uL (ref 4.5–10.5)

## 2011-12-31 LAB — LIPID PANEL
Cholesterol: 150 mg/dL (ref 0–200)
HDL: 34 mg/dL — ABNORMAL LOW (ref >39.00)
Triglycerides: 192 mg/dL — ABNORMAL HIGH (ref 0.0–149.0)
VLDL: 38.4 mg/dL (ref 0.0–40.0)

## 2011-12-31 LAB — TSH: TSH: 1.37 u[IU]/mL (ref 0.35–5.50)

## 2011-12-31 MED ORDER — FENOFIBRATE 160 MG PO TABS: 160.0000 mg | ORAL_TABLET | Freq: Every day | ORAL | Status: DC

## 2011-12-31 NOTE — Patient Instructions (Signed)
Today we updated your med list in our EPIC system...    Continue your current medications the same...    We refilled your med per request...  Today we did your follow up fasting blood work...    Please call the PHONE [facility]REE in a few days for your results...    Dial [id number] & when prompted enter your patient number followed by the # symbol...    Your patient number is:  [id number]#  Call for any questions...  Let's plan a follow up exam in 1 years time.Marland KitchenMarland Kitchen

## 2012-01-16 IMAGING — CR DG CHEST 2V
2 series · 2 of 2 positions shown · non-contrast
Comparison: 03/24/2008

CLINICAL DATA: Atypical chest pain.  Ex-smoker.

CHEST - 2 VIEW

[view not recorded (1 of 2)]
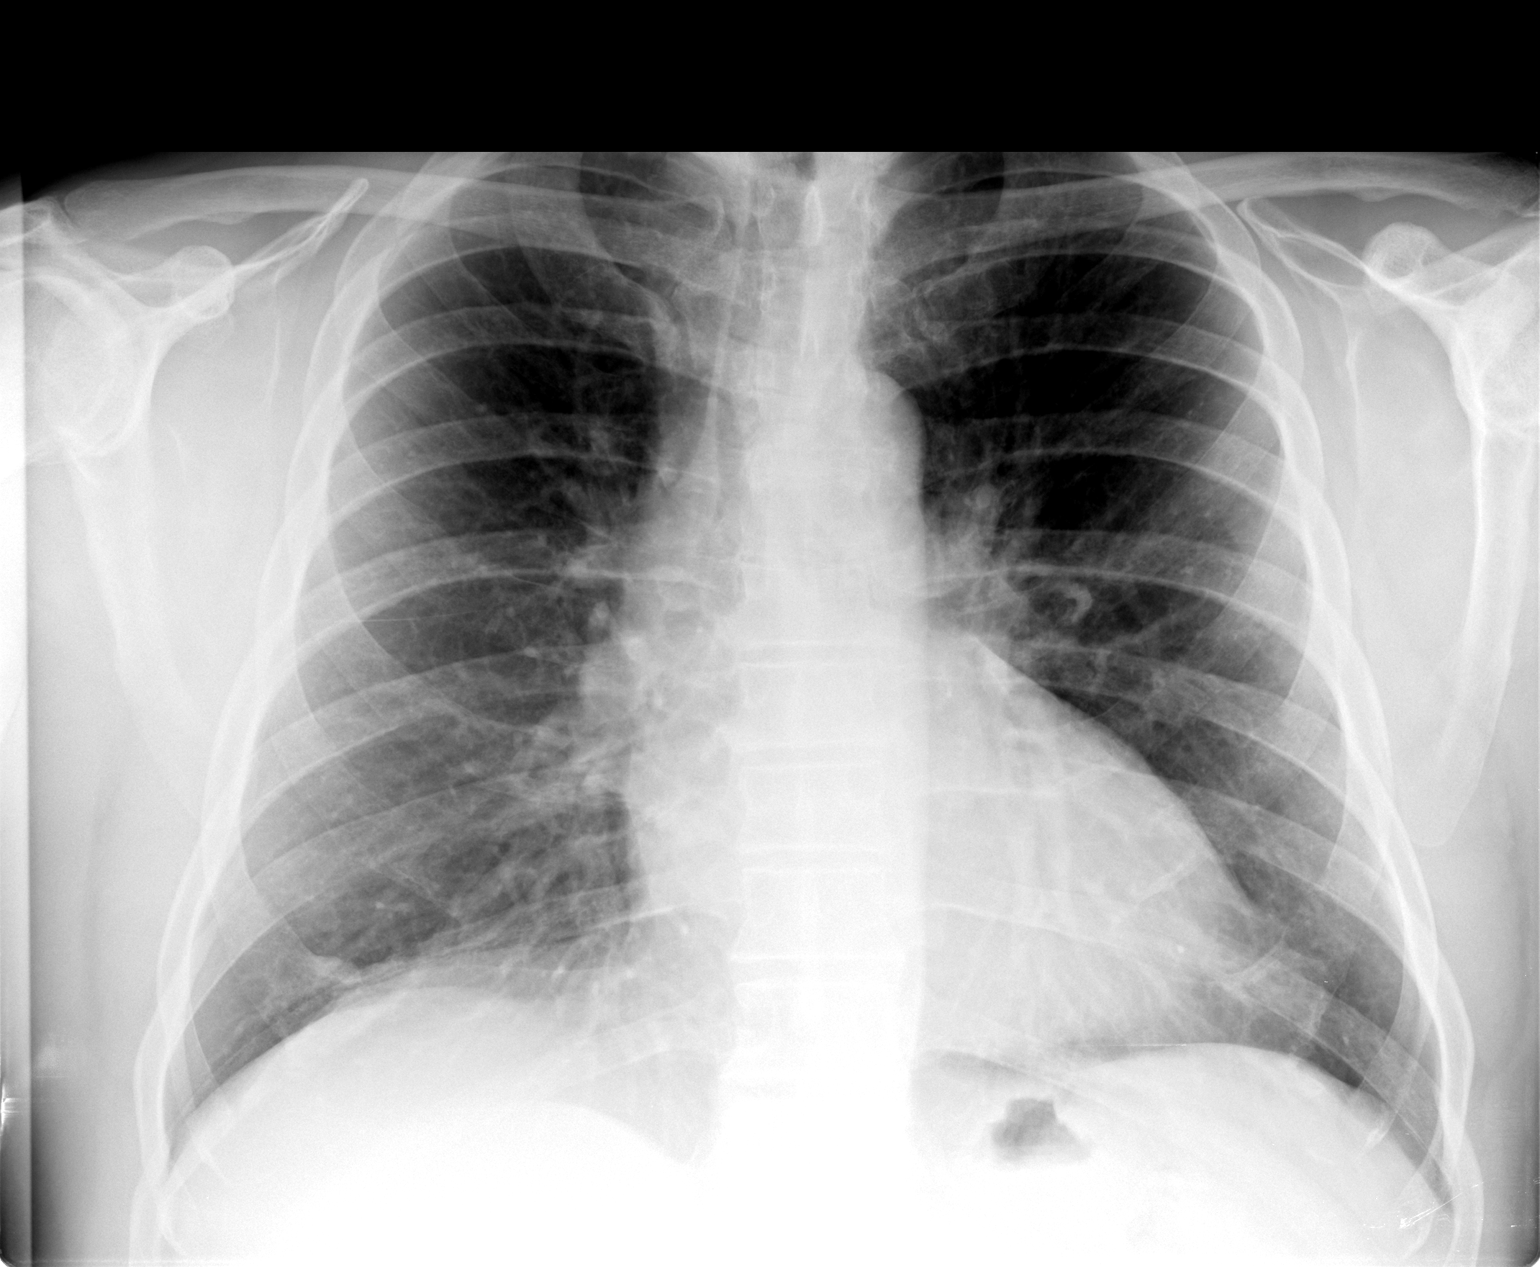

[view not recorded (2 of 2)]
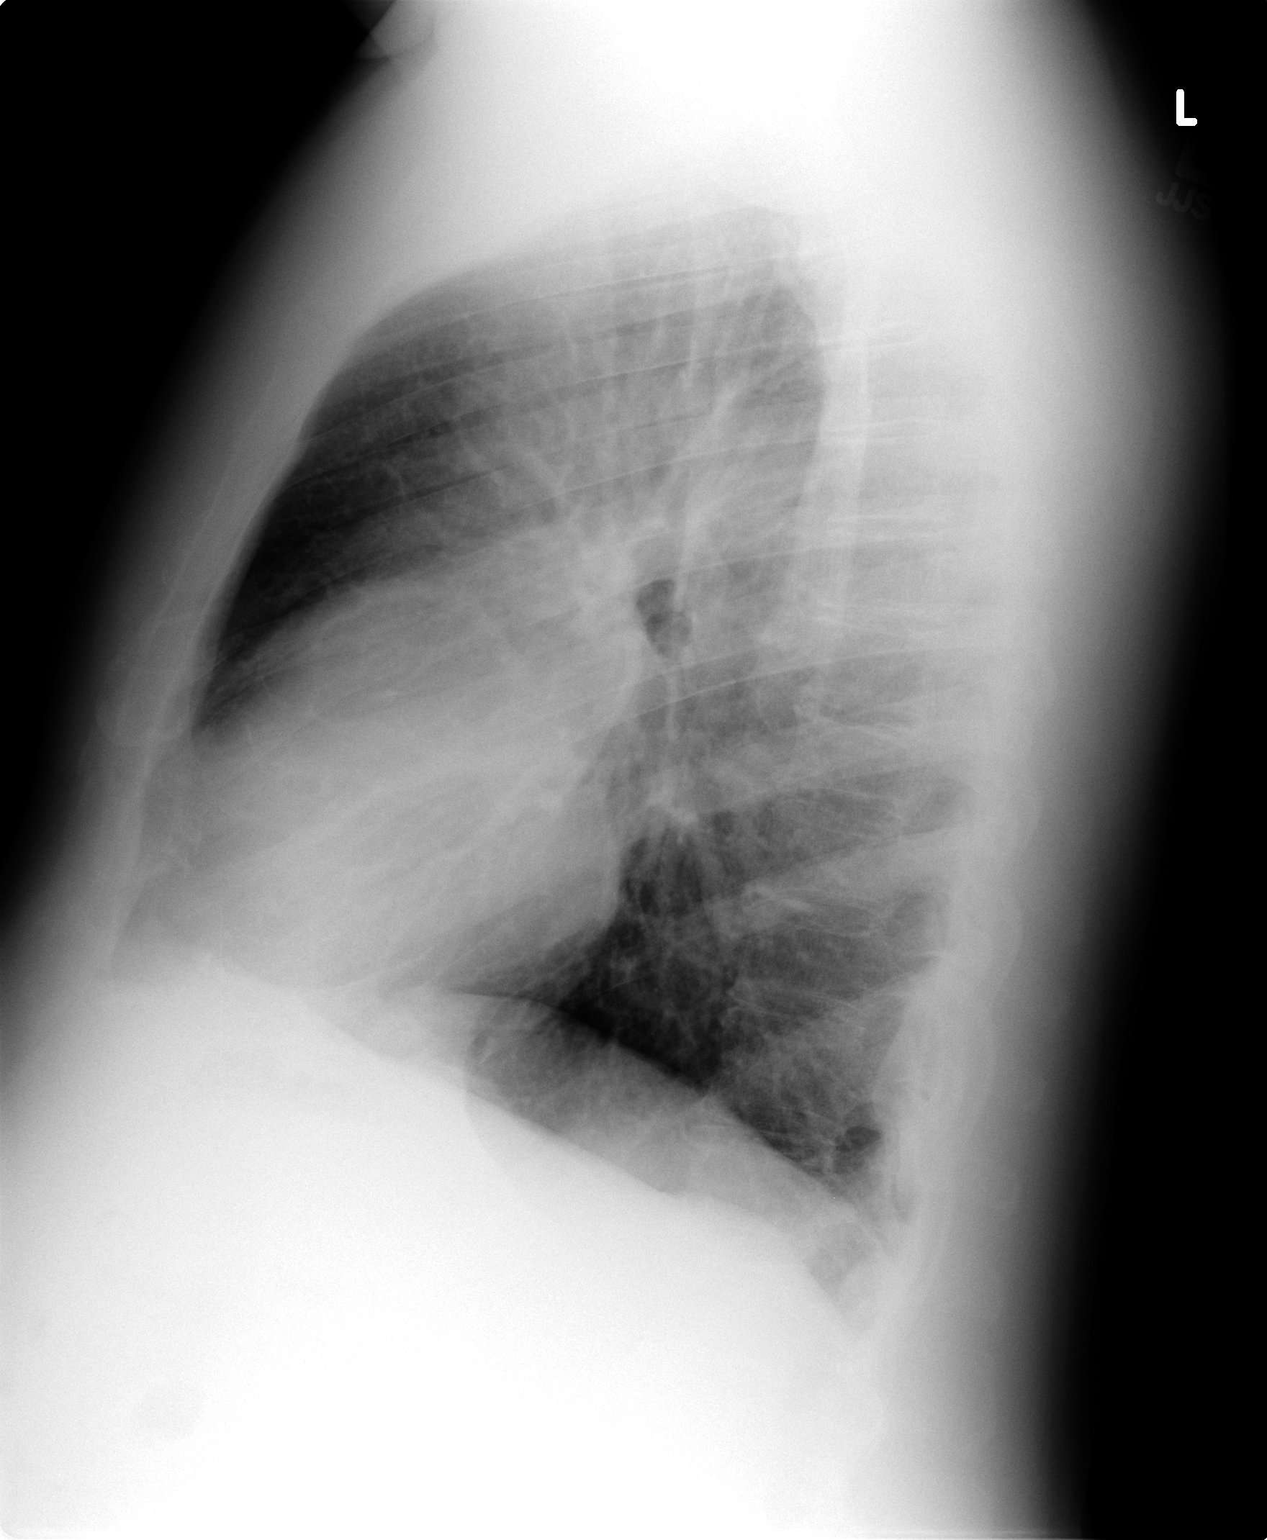

[2 of 2 positions shown; findings below may reference images not displayed]

FINDINGS: Mild hyperinflation is seen with some associated
flattening of the hemidiaphragms suggesting early or mild
obstructive change.  Taking this into consideration heart size is
upper limits of normal.  Mediastinal contours are stable and within
normal limits.  The lung fields are clear with no signs of focal
infiltrate or congestive failure.  No pleural fluid or
peribronchial cuffing is seen.

Bony structures appear intact.
IMPRESSION: Stable cardiopulmonary appearance with no new focal or acute
abnormality seen.  Stable mild hyperinflation and possible mild
COPD.

## 2012-01-31 ENCOUNTER — Encounter: Payer: Self-pay | Admitting: Pulmonary Disease
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
## 2012-12-17 ENCOUNTER — Other Ambulatory Visit: Payer: Self-pay | Admitting: Pulmonary Disease

## 2013-01-05 ENCOUNTER — Ambulatory Visit: Payer: BC Managed Care – PPO | Admitting: Pulmonary Disease

## 2013-02-12 ENCOUNTER — Encounter: Payer: Self-pay | Admitting: Pulmonary Disease

## 2013-02-12 ENCOUNTER — Ambulatory Visit (INDEPENDENT_AMBULATORY_CARE_PROVIDER_SITE_OTHER): Payer: Managed Care, Other (non HMO) | Admitting: Pulmonary Disease

## 2013-02-12 ENCOUNTER — Other Ambulatory Visit (INDEPENDENT_AMBULATORY_CARE_PROVIDER_SITE_OTHER): Payer: Managed Care, Other (non HMO)

## 2013-02-12 VITALS — BP 120/90 | HR 65 | Temp 98.0°F | Ht 68.5 in | Wt 244.0 lb

## 2013-02-12 DIAGNOSIS — Z0000 Encounter for general adult medical examination without abnormal findings: Secondary | ICD-10-CM

## 2013-02-12 DIAGNOSIS — J329 Chronic sinusitis, unspecified: Secondary | ICD-10-CM

## 2013-02-12 DIAGNOSIS — D320 Benign neoplasm of cerebral meninges: Secondary | ICD-10-CM

## 2013-02-12 DIAGNOSIS — E785 Hyperlipidemia, unspecified: Secondary | ICD-10-CM

## 2013-02-12 DIAGNOSIS — K5730 Diverticulosis of large intestine without perforation or abscess without bleeding: Secondary | ICD-10-CM

## 2013-02-12 DIAGNOSIS — M1990 Unspecified osteoarthritis, unspecified site: Secondary | ICD-10-CM

## 2013-02-12 DIAGNOSIS — R0789 Other chest pain: Secondary | ICD-10-CM

## 2013-02-12 DIAGNOSIS — E663 Overweight: Secondary | ICD-10-CM

## 2013-02-12 DIAGNOSIS — D126 Benign neoplasm of colon, unspecified: Secondary | ICD-10-CM

## 2013-02-12 DIAGNOSIS — R972 Elevated prostate specific antigen [PSA]: Secondary | ICD-10-CM

## 2013-02-12 LAB — LIPID PANEL
Cholesterol: 132 mg/dL (ref 0–200)
HDL: 27.3 mg/dL — ABNORMAL LOW (ref >39.00)
VLDL: 53 mg/dL — ABNORMAL HIGH (ref 0.0–40.0)

## 2013-02-12 LAB — CBC WITH DIFFERENTIAL/PLATELET
Basophils Relative: 0.8 % (ref 0.0–3.0)
Eosinophils Relative: 4 % (ref 0.0–5.0)
HCT: 44 % (ref 39.0–52.0)
Monocytes Relative: 9.9 % (ref 3.0–12.0)
Neutrophils Relative %: 58.8 % (ref 43.0–77.0)
Platelets: 181 10*3/uL (ref 150.0–400.0)
RBC: 4.8 Mil/uL (ref 4.22–5.81)
WBC: 6.9 10*3/uL (ref 4.5–10.5)

## 2013-02-12 LAB — BASIC METABOLIC PANEL
BUN: 20 mg/dL (ref 6–23)
Creatinine, Ser: 1.1 mg/dL (ref 0.4–1.5)
GFR: 68.96 mL/min (ref >60.00)
Glucose, Bld: 95 mg/dL (ref 70–99)
Potassium: 4.1 mEq/L (ref 3.5–5.1)

## 2013-02-12 LAB — LDL CHOLESTEROL, DIRECT: Direct LDL: 57.7 mg/dL

## 2013-02-12 LAB — PSA: PSA: 3.4 ng/mL (ref 0.10–4.00)

## 2013-02-12 LAB — HEPATIC FUNCTION PANEL
ALT: 32 U/L (ref 0–53)
AST: 27 U/L (ref 0–37)
Albumin: 4 g/dL (ref 3.5–5.2)
Total Bilirubin: 0.7 mg/dL (ref 0.3–1.2)

## 2013-02-12 NOTE — Patient Instructions (Addendum)
Today we updated your med list in our EPIC system...    Continue your current medications the same...  Today we did your follow up FASTING blood work...    We will contact you w/ the results when available...   Let's get on track w/ our diet 7 exercise program...    The goal is to lose 15-20 lbs to start...  Call for any questions...  Let's plan a follow up visit in 39yr, sooner if needed for problems.Marland KitchenMarland Kitchen

## 2013-02-13 LAB — URINALYSIS
Hgb urine dipstick: NEGATIVE
Ketones, ur: NEGATIVE
Leukocytes, UA: NEGATIVE
Specific Gravity, Urine: 1.02 (ref 1.000–1.030)
Urobilinogen, UA: 0.2 (ref 0.0–1.0)

## 2013-02-16 ENCOUNTER — Encounter: Payer: Self-pay | Admitting: Pulmonary Disease

## 2013-02-16 DIAGNOSIS — E663 Overweight: Secondary | ICD-10-CM | POA: Insufficient documentation
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
## 2013-09-22 ENCOUNTER — Telehealth: Payer: Self-pay | Admitting: Pulmonary Disease

## 2013-09-22 NOTE — Telephone Encounter (Signed)
Pt's spouse has decided over the course of our conversation that she does not want to have labs ordered at this time for the pt d/t pt's insurance.  Nothing further needed at this time.  Antionette Fairy

## 2014-03-10 ENCOUNTER — Ambulatory Visit: Payer: Managed Care, Other (non HMO) | Admitting: Pulmonary Disease

## 2015-09-09 ENCOUNTER — Telehealth: Payer: Self-pay | Admitting: Internal Medicine

## 2015-09-09 NOTE — Telephone Encounter (Signed)
Patient is due for colon .  He is scheduled for colon 09/19/15 and pre-visit 09/15/15

## 2015-09-09 NOTE — Telephone Encounter (Signed)
Per the pt's wife they are very upset, because they never received a recall letter for the pt to schedule his colonoscopy. She says the same thing happened to her last year. She says she wants to make sure the pt is due for his colon before scheduling. She would like for him to be scheduled for this year if he does need one. She says all this is really bad for business. States she's called 3 times already and no one has been willing to help her out.

## 2015-09-15 ENCOUNTER — Ambulatory Visit (AMBULATORY_SURGERY_CENTER): Payer: Managed Care, Other (non HMO)

## 2015-09-15 ENCOUNTER — Encounter: Payer: Self-pay | Admitting: Internal Medicine

## 2015-09-15 VITALS — Ht 68.0 in | Wt 236.4 lb

## 2015-09-15 DIAGNOSIS — Z860100 Personal history of colon polyps, unspecified: Secondary | ICD-10-CM

## 2015-09-15 NOTE — Progress Notes (Signed)
No allergies to eggs or soy No home oxygen No past problems with anesthesia No diet/weight loss meds  Has email; refused emmi

## 2015-09-19 ENCOUNTER — Encounter: Payer: Self-pay | Admitting: Internal Medicine

## 2015-09-19 ENCOUNTER — Ambulatory Visit (AMBULATORY_SURGERY_CENTER): Payer: Managed Care, Other (non HMO) | Admitting: Internal Medicine

## 2015-09-19 VITALS — BP 123/82 | HR 64 | Temp 97.2°F | Resp 13 | Ht 68.0 in | Wt 236.0 lb

## 2015-09-19 DIAGNOSIS — L309 Dermatitis, unspecified: Secondary | ICD-10-CM

## 2015-09-19 DIAGNOSIS — K648 Other hemorrhoids: Secondary | ICD-10-CM

## 2015-09-19 DIAGNOSIS — Z86010 Personal history of colonic polyps: Secondary | ICD-10-CM | POA: Diagnosis not present

## 2015-09-19 MED ORDER — SODIUM CHLORIDE 0.9 % IV SOLN: 500.0000 mL | INTRAVENOUS | Status: DC

## 2015-09-19 MED ORDER — NYSTATIN-TRIAMCINOLONE 100000-0.1 UNIT/GM-% EX OINT: 1.0000 "application " | TOPICAL_OINTMENT | Freq: Two times a day (BID) | CUTANEOUS | Status: DC

## 2015-09-19 NOTE — Op Note (Signed)
[place]  [address]. [place], [address]   COLONOSCOPY PROCEDURE REPORT  PATIENT: Jimmy, Lamb  MR#: [place]:[id number] BIRTHDATE: 02-20-1951 , 64  yrs. old GENDER: male ENDOSCOPIST: Gatha Mayer, MD, [facility] PROCEDURE DATE:  09/19/2015 PROCEDURE:   Colonoscopy, surveillance First Screening Colonoscopy - Avg.  risk and is 50 yrs.  old or older - No.  Prior Negative Screening - Now for repeat screening. N/A  History of Adenoma - Now for follow-up colonoscopy & has been > or = to 3 yrs.  Yes hx of adenoma.  Has been 3 or more years since last colonoscopy.  Polyps removed today? No Recommend repeat exam, <10 yrs? Yes high risk ASA CLASS:   Class II INDICATIONS:Surveillance due to prior colonic neoplasia and PH Colon Adenoma. MEDICATIONS: Propofol 400 mg IV and Monitored anesthesia care  DESCRIPTION OF PROCEDURE:   After the risks benefits and alternatives of the procedure were thoroughly explained, informed consent was obtained.  The digital rectal exam revealed dermatitis and several skin tags, revealed no prostatic nodules, and revealed the prostate was not enlarged.   The LB [id number]  endoscope was introduced through the anus and advanced to the cecum, which was identified by both the appendix and ileocecal valve. No adverse events experienced.   The quality of the prep was good.  (MiraLax was used)  The instrument was then slowly withdrawn as the colon was fully examined. Estimated blood loss is zero unless otherwise noted in this procedure report.  COLON FINDINGS: There was severe diverticulosis noted in the sigmoid colon.   Internal hemorrhoids were found.   The examination was otherwise normal.  Retroflexed views revealed internal hemorrhoids. The time to cecum = 3.1 Withdrawal time = 9.1   The scope was withdrawn and the procedure completed. COMPLICATIONS: There were no immediate complications.  ENDOSCOPIC IMPRESSION: 1.   Severe diverticulosis was  noted in the sigmoid colon 2.   Internal hemorrhoids 3.   The examination was otherwise normal - good prep - hx adenomas 2004 and 2011  RECOMMENDATIONS: 1.  Repeat Colonoscopy in 5 years. 2.  Treat perianal dermatitis with Nystatin-triamcinolone ointment 3.  Patient will make appointment to see me about treatment of hemorrhoids with banding if desired.  eSigned:  Gatha Mayer, MD, [facility] 09/19/2015 2:09 PM   cc: The Patient

## 2015-09-19 NOTE — Patient Instructions (Addendum)
No polyps  today! Given prior polyps 2004 and 2011 I recommend you be considered to repeat colonoscopy 2021.   You do have a perianal rash - I prescribed an ointment to help that.  You also have internal hemorrhoids - please read the handout and schedule a visit with me if you are interested in getting them banded.  I appreciate the opportunity to care for you. Gatha Mayer, MD, FACG     YOU HAD AN ENDOSCOPIC PROCEDURE TODAY AT [facility]:   Refer to the procedure report that was given to you for any specific questions about what was found during the examination.  If the procedure report does not answer your questions, please call your gastroenterologist to clarify.  If you requested that your care partner not be given the details of your procedure findings, then the procedure report has been included in a sealed envelope for you to review at your convenience later.  YOU SHOULD EXPECT: Some feelings of bloating in the abdomen. Passage of more gas than usual.  Walking can help get rid of the air that was put into your GI tract during the procedure and reduce the bloating. If you had a lower endoscopy (such as a colonoscopy or flexible sigmoidoscopy) you may notice spotting of blood in your stool or on the toilet paper. If you underwent a bowel prep for your procedure, you may not have a normal bowel movement for a few days.  Please Note:  You might notice some irritation and congestion in your nose or some drainage.  This is from the oxygen used during your procedure.  There is no need for concern and it should clear up in a day or so.  SYMPTOMS TO REPORT IMMEDIATELY:   Following lower endoscopy (colonoscopy or flexible sigmoidoscopy):  Excessive amounts of blood in the stool  Significant tenderness or worsening of abdominal pains  Swelling of the abdomen that is new, acute  Fever of 100F or higher   For urgent or emergent issues, a gastroenterologist can  be reached at any hour by calling [phone].   DIET: Your first meal following the procedure should be a small meal and then it is ok to progress to your normal diet. Heavy or fried foods are harder to digest and may make you feel nauseous or bloated.  Likewise, meals heavy in dairy and vegetables can increase bloating.  Drink plenty of fluids but you should avoid alcoholic beverages for 24 hours.  ACTIVITY:  You should plan to take it easy for the rest of today and you should NOT DRIVE or use heavy machinery until tomorrow (because of the sedation medicines used during the test).    FOLLOW UP: Our staff will call the number listed on your records the next business day following your procedure to check on you and address any questions or concerns that you may have regarding the information given to you following your procedure. If we do not reach you, we will leave a message.  However, if you are feeling well and you are not experiencing any problems, there is no need to return our call.  We will assume that you have returned to your regular daily activities without incident.  If any biopsies were taken you will be contacted by phone or by letter within the next 1-3 weeks.  Please call us at [phone] if you have not heard about the biopsies in 3 weeks.    SIGNATURES/CONFIDENTIALITY: You and/or  your care partner have signed paperwork which will be entered into your electronic medical record.  These signatures attest to the fact that that the information above on your After Visit Summary has been reviewed and is understood.  Full responsibility of the confidentiality of this discharge information lies with you and/or your care-partner.   Resume medications. Information given on diverticulosis and hemorrhoids.

## 2015-09-19 NOTE — Progress Notes (Signed)
To recovery, report to Brown, RN, VSS. 

## 2015-09-20 ENCOUNTER — Telehealth: Payer: Self-pay | Admitting: Internal Medicine

## 2015-09-20 NOTE — Telephone Encounter (Signed)
°  Follow up Call-  Call back number 09/19/2015  Post procedure Call Back phone  # [phone]  Permission to leave phone message Yes     No answer at number given. Left message on voice mail.

## 2015-09-27 MED ORDER — CLOTRIMAZOLE-BETAMETHASONE 1-0.05 % EX CREA: 1.0000 "application " | TOPICAL_CREAM | Freq: Two times a day (BID) | CUTANEOUS | Status: DC

## 2015-09-27 NOTE — Addendum Note (Signed)
Addended by: Silvano Rusk E on: 09/27/2015 02:40 PM   Modules accepted: Orders, Medications

## 2016-02-24 ENCOUNTER — Encounter: Payer: Self-pay | Admitting: Internal Medicine

## 2020-11-18 ENCOUNTER — Encounter: Payer: Self-pay | Admitting: Internal Medicine

## 2021-01-09 ENCOUNTER — Other Ambulatory Visit: Payer: Self-pay

## 2021-01-09 ENCOUNTER — Ambulatory Visit (AMBULATORY_SURGERY_CENTER): Payer: Managed Care, Other (non HMO) | Admitting: *Deleted

## 2021-01-09 VITALS — Ht 68.0 in | Wt 186.0 lb

## 2021-01-09 DIAGNOSIS — Z86010 Personal history of colonic polyps: Secondary | ICD-10-CM

## 2021-01-09 NOTE — Progress Notes (Signed)
No egg or soy allergy known to patient  No issues with past sedation with any surgeries or procedures No intubation problems in the past  No FH of Malignant Hyperthermia No diet pills per patient No home 02 use per patient  No blood thinners per patient  Pt denies issues with constipation  No A fib or A flutter  COVID 19 guidelines implemented in PV today with Pt and RN  Pt is fully vaccinated  for Covid   Due to the COVID-19 pandemic we are asking patients to follow certain guidelines.  Pt aware of COVID protocols and [facility] guidelines   Pt verified name, DOB, address and insurance during PV today. Pt mailed instruction packet to included paper to complete and mail back to [facility] with addressed and stamped envelope, Emmi video, copy of consent form to read and not return, and instructions. PV completed over the phone. Pt encouraged to call with questions or issues.  My Chart instructions to pt as well   E mailed instrucxtions to [email or web address]

## 2021-01-23 ENCOUNTER — Encounter: Payer: Self-pay | Admitting: Internal Medicine

## 2021-01-23 ENCOUNTER — Other Ambulatory Visit: Payer: Self-pay

## 2021-01-23 ENCOUNTER — Ambulatory Visit (AMBULATORY_SURGERY_CENTER): Payer: Medicare Other | Admitting: Internal Medicine

## 2021-01-23 VITALS — BP 113/70 | HR 56 | Temp 97.1°F | Resp 10 | Ht 68.0 in | Wt 168.0 lb

## 2021-01-23 DIAGNOSIS — D122 Benign neoplasm of ascending colon: Secondary | ICD-10-CM | POA: Diagnosis not present

## 2021-01-23 DIAGNOSIS — D125 Benign neoplasm of sigmoid colon: Secondary | ICD-10-CM

## 2021-01-23 DIAGNOSIS — Z86010 Personal history of colonic polyps: Secondary | ICD-10-CM | POA: Diagnosis not present

## 2021-01-23 DIAGNOSIS — D123 Benign neoplasm of transverse colon: Secondary | ICD-10-CM | POA: Diagnosis not present

## 2021-01-23 MED ORDER — SODIUM CHLORIDE 0.9 % IV SOLN
500.0000 mL | Freq: Once | INTRAVENOUS | Status: DC
Start: 2021-01-23 — End: 2021-01-23

## 2021-01-23 NOTE — Patient Instructions (Addendum)
I found and removed 3 tiny polyps.  You also have diverticulosis - thickened muscle rings and pouches in the colon wall. Please read the handout about this condition.  Hemorrhoids were inflamed also. This should calm down. If you have persistent hemorrhoid problems (swelling, itching, bleeding) I am able to treat those with an in-office procedure. If you like, please call my office at [phone] to schedule an appointment and I can evaluate you further.  I appreciate the opportunity to care for you. Gatha Mayer, MD, FACG  YOU HAD AN ENDOSCOPIC PROCEDURE TODAY AT [facility]:   Refer to the procedure report that was given to you for any specific questions about what was found during the examination.  If the procedure report does not answer your questions, please call your gastroenterologist to clarify.  If you requested that your care partner not be given the details of your procedure findings, then the procedure report has been included in a sealed envelope for you to review at your convenience later.  YOU SHOULD EXPECT: Some feelings of bloating in the abdomen. Passage of more gas than usual.  Walking can help get rid of the air that was put into your GI tract during the procedure and reduce the bloating. If you had a lower endoscopy (such as a colonoscopy or flexible sigmoidoscopy) you may notice spotting of blood in your stool or on the toilet paper. If you underwent a bowel prep for your procedure, you may not have a normal bowel movement for a few days.  Please Note:  You might notice some irritation and congestion in your nose or some drainage.  This is from the oxygen used during your procedure.  There is no need for concern and it should clear up in a day or so.  SYMPTOMS TO REPORT IMMEDIATELY:   Following lower endoscopy (colonoscopy or flexible sigmoidoscopy):  Excessive amounts of blood in the stool  Significant tenderness or worsening of abdominal pains  Swelling  of the abdomen that is new, acute  Fever of 100F or higher   For urgent or emergent issues, a gastroenterologist can be reached at any hour by calling [phone]. Do not use MyChart messaging for urgent concerns.    DIET:  We do recommend a small meal at first, but then you may proceed to your regular diet.  Drink plenty of fluids but you should avoid alcoholic beverages for 24 hours.  ACTIVITY:  You should plan to take it easy for the rest of today and you should NOT DRIVE or use heavy machinery until tomorrow (because of the sedation medicines used during the test).    FOLLOW UP: Our staff will call the number listed on your records 48-72 hours following your procedure to check on you and address any questions or concerns that you may have regarding the information given to you following your procedure. If we do not reach you, we will leave a message.  We will attempt to reach you two times.  During this call, we will ask if you have developed any symptoms of COVID 19. If you develop any symptoms (ie: fever, flu-like symptoms, shortness of breath, cough etc.) before then, please call [phone].  If you test positive for Covid 19 in the 2 weeks post procedure, please call and report this information to [place].    If any biopsies were taken you will be contacted by phone or by letter within the next 1-3 weeks.  Please call us at [phone] if you have not heard about the biopsies in 3 weeks.    SIGNATURES/CONFIDENTIALITY: You and/or your care partner have signed paperwork which will be entered into your electronic medical record.  These signatures attest to the fact that that the information above on your After Visit Summary has been reviewed and is understood.  Full responsibility of the confidentiality of this discharge information lies with you and/or your care-partner.

## 2021-01-23 NOTE — Progress Notes (Signed)
Pt's states no medical or surgical changes since previsit or office visit.  CW vitals and AW IV. 

## 2021-01-23 NOTE — Progress Notes (Signed)
PT taken to PACU. Monitors in place. VSS. Report given to RN. 

## 2021-01-23 NOTE — Op Note (Signed)
[name] Procedure Date: 01/23/2021 8:45 AM MRN: [id number] Endoscopist: Gatha Mayer , MD Age: 70 Referring MD:  Date of Birth: Aug 24, 1951 Gender: Male Account #: [id number] Procedure:                Colonoscopy Indications:              Surveillance: Personal history of adenomatous                            polyps on last colonoscopy > 5 years ago Medicines:                Propofol per Anesthesia, Monitored Anesthesia Care Procedure:                Pre-Anesthesia Assessment:                           - Prior to the procedure, a History and Physical                            was performed, and patient medications and                            allergies were reviewed. The patient's tolerance of                            previous anesthesia was also reviewed. The risks                            and benefits of the procedure and the sedation                            options and risks were discussed with the patient.                            All questions were answered, and informed consent                            was obtained. Prior Anticoagulants: The patient has                            taken no previous anticoagulant or antiplatelet                            agents. ASA Grade Assessment: II - A patient with                            mild systemic disease. After reviewing the risks                            and benefits, the patient was deemed in                            satisfactory condition to undergo the procedure.  After obtaining informed consent, the colonoscope                            was passed under direct vision. Throughout the                            procedure, the patient's blood pressure, pulse, and                            oxygen saturations were monitored continuously. The                            Olympus CF-HQ190 [id number]) Colonoscope was                             introduced through the anus and advanced to the the                            cecum, identified by appendiceal orifice and                            ileocecal valve. The colonoscopy was performed                            without difficulty. The patient tolerated the                            procedure well. The quality of the bowel                            preparation was good. The ileocecal valve,                            appendiceal orifice, and rectum were photographed.                            The bowel preparation used was Miralax via split                            dose instruction. Scope In: 8:58:04 AM Scope Out: 9:17:43 AM Scope Withdrawal Time: 0 hours 16 minutes 9 seconds  Total Procedure Duration: 0 hours 19 minutes 39 seconds  Findings:                 The perianal and digital rectal examinations were                            normal. Pertinent negatives include normal prostate                            (size, shape, and consistency).                           Three sessile polyps were found in the sigmoid  colon, transverse colon and ascending colon. The                            polyps were diminutive in size. These polyps were                            removed with a cold snare. Resection and retrieval                            were complete. Verification of patient                            identification for the specimen was done. Estimated                            blood loss was minimal.                           Multiple diverticula were found in the sigmoid                            colon and descending colon.                           Internal hemorrhoids were found. The hemorrhoids                            were Grade II (internal hemorrhoids that prolapse                            but reduce spontaneously).                           The exam was otherwise without abnormality on                            direct and  retroflexion views. Complications:            No immediate complications. Estimated Blood Loss:     Estimated blood loss was minimal. Impression:               - Three diminutive polyps in the sigmoid colon, in                            the transverse colon and in the ascending colon,                            removed with a cold snare. Resected and retrieved.                           - Diverticulosis in the sigmoid colon and in the                            descending colon.                           -  Internal hemorrhoids.                           - The examination was otherwise normal on direct                            and retroflexion views.                           - Personal history of colonic polyps.                           05/19/2003 - adenomas                           08/2010 2 diminutive adenomas                           09/19/2015 no polyps                           - Recommendation:           - Patient has a contact number available for                            emergencies. The signs and symptoms of potential                            delayed complications were discussed with the                            patient. Return to normal activities tomorrow.                            Written discharge instructions were provided to the                            patient.                           - Resume previous diet.                           - Continue present medications.                           - Repeat colonoscopy is recommended for                            surveillance. The colonoscopy date will be                            determined after pathology results from today's                            exam become available for review.  cc: Dr. Pamala Hurry [facility] FAX [phone] Gatha Mayer, MD 01/23/2021 9:28:56 AM This report has been signed electronically.

## 2021-01-23 NOTE — Progress Notes (Signed)
Called to room to assist during endoscopic procedure.  Patient ID and intended procedure confirmed with present staff. Received instructions for my participation in the procedure from the performing physician.  

## 2021-01-25 ENCOUNTER — Telehealth: Payer: Self-pay | Admitting: *Deleted

## 2021-01-25 ENCOUNTER — Telehealth: Payer: Self-pay

## 2021-01-25 NOTE — Telephone Encounter (Signed)
Follow up call. No answer. VM left.  

## 2021-01-25 NOTE — Telephone Encounter (Signed)
  Follow up Call-  Call back number 01/23/2021  Post procedure Call Back phone  # [phone]  Permission to leave phone message Yes  Some recent data might be hidden     Patient questions:  Do you have a fever, pain , or abdominal swelling? No. Pain Score  0 *  Have you tolerated food without any problems? Yes.    Have you been able to return to your normal activities? Yes.    Do you have any questions about your discharge instructions: Diet   No. Medications  No. Follow up visit  No.  Do you have questions or concerns about your Care? No.  Actions: * If pain score is 4 or above: No action needed, pain <4.  1. Have you developed a fever since your procedure? no  2.   Have you had an respiratory symptoms (SOB or cough) since your procedure? no  3.   Have you tested positive for COVID 19 since your procedure no  4.   Have you had any family members/close contacts diagnosed with the COVID 19 since your procedure?  no   If yes to any of these questions please route to Joylene John, RN and Joella Prince, RN

## 2021-01-31 ENCOUNTER — Encounter: Payer: Self-pay | Admitting: Internal Medicine

## 2021-01-31 DIAGNOSIS — Z86010 Personal history of colonic polyps: Secondary | ICD-10-CM

## 2021-12-21 ENCOUNTER — Ambulatory Visit (INDEPENDENT_AMBULATORY_CARE_PROVIDER_SITE_OTHER): Payer: Medicare Other | Admitting: Sports Medicine

## 2021-12-21 DIAGNOSIS — M533 Sacrococcygeal disorders, not elsewhere classified: Secondary | ICD-10-CM | POA: Diagnosis not present

## 2021-12-21 DIAGNOSIS — G8929 Other chronic pain: Secondary | ICD-10-CM | POA: Diagnosis not present

## 2021-12-21 NOTE — Progress Notes (Signed)
PCP: Patient, No Pcp Per (Inactive)  Subjective:   HPI: Patient is a 71 y.o. male here for low back and hip pain.  L hip pain started about 2 years ago. No inciting injury or trauma. Endorses sharp pain at top of L hip when going from seated to standing, or when standing for a long period of time in which its more of a dull ache. Has seen chiropractor who did an adjustment to lower back which seemed to help it. Can stand for 20-30 mintues before aching, before seeing chiropractor can only go 5-10 minutes. Thinks now that the low back pain hurts less, hip seems to be more obvious. Has been doing some lower back stretches daily. Is doing martial arts 3x a week, and lifts weights 5x a week. No hip pain during the exercises.   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 Wonders if L hip is deteriorating like right did. Also wondering if lift in left shoe is the right size.    Objective:  Physical Exam:  Gen: NAD, comfortable in exam room  L Hip:  - Inspection: No gross deformity, no swelling, erythema, or ecchymosis  - Gait normal without hip drop  - Palpation: TTP over SI joint  - ROM: significantly decreased ROM when L leg extended off of the table - Strength: Normal strength in all fields b/l - Neuro/vasc: NV intact distally b/l - Special Tests: Negative FABER and FADIR       Assessment & Plan:  1. L hip pain  Patient presents with chronic L hip pain for the past couple of years, worsened with going from sitting to standing, or after standing for a while. Physical exam remarkable for TTP over left SI joint and significantly reduced ROM of L leg when extended off table compared to R side. 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 Gave patient certain stretches to do and will plan to follow up in 4-6 weeks if no improvement. Patient agreeable with plan.    I observed and examined the patient with the resident and agree with assessment and plan.  Note reviewed and modified by me. Ila Mcgill, MD

## 2021-12-22 DIAGNOSIS — G8929 Other chronic pain: Secondary | ICD-10-CM | POA: Insufficient documentation

## 2021-12-22 DIAGNOSIS — M533 Sacrococcygeal disorders, not elsewhere classified: Secondary | ICD-10-CM | POA: Insufficient documentation

## 2021-12-22 NOTE — Assessment & Plan Note (Signed)
Good candidate for HEP with stretches and strength work  Will reck if not responding

## 2024-10-20 ENCOUNTER — Ambulatory Visit: Admitting: Gastroenterology

## 2024-10-20 ENCOUNTER — Encounter: Payer: Self-pay | Admitting: Gastroenterology

## 2024-10-20 VITALS — BP 120/70 | HR 75 | Ht 68.0 in | Wt 193.0 lb

## 2024-10-20 DIAGNOSIS — Z860100 Personal history of colon polyps, unspecified: Secondary | ICD-10-CM

## 2024-10-20 DIAGNOSIS — K648 Other hemorrhoids: Secondary | ICD-10-CM

## 2024-10-20 NOTE — Progress Notes (Signed)
 Jimmy Lamb [id number] Jun 24, 1951   Chief Complaint: Hemorrhoids  Referring Provider: No ref. provider found Primary GI MD: Dr. Avram  HPI: Jimmy Lamb is a 73 y.o. male with past medical history of diverticulosis, GERD, colon polyps, HLD who presents today for a complaint of hemorrhoids.    Last colonoscopy done 01/2021 with finding of 3 diminutive polyps, diverticulosis, internal hemorrhoids.  Personal history of adenomatous colon polyps and recommended to have recall in 5 years.   Discussed the use of AI scribe software for clinical note transcription with the patient, who gave verbal consent to proceed.  History of Present Illness Jimmy Lamb is a 73 year old male who presents with hemorrhoid banding. He is accompanied by his wife.  Hemorrhoidal symptoms - Hemorrhoids present for 10 to 12 years, typically without significant discomfort. - Irritation occurs two to three times per year, lasting a few days before resolving. - External hemorrhoids develop only during episodes of irritation and spontaneously reduce afterward. - Rare, minor bleeding during irritation, described as a drop of blood; no significant bleeding. - Uses over-the-counter triple antibiotic cream for healing, which is more effective than hydrocortisone or Preparation H. - Manages bowel movements at a consistent time, usually around 10 AM, followed by a shower to minimize irritation.  Bowel habits - Maintains regular daily bowel movements. - No current abdominal pain. - No changes in bowel habits.  Ulcerative colitis history - Ulcerative colitis diagnosed in 1975, stress-related and resolved without ongoing treatment. - No ongoing symptoms or treatment for ulcerative colitis.   Previous GI Procedures/Imaging   Colonoscopy 01/23/2021 - Three diminutive polyps in the sigmoid colon, in the transverse colon and in the ascending colon, removed with a cold snare. Resected and retrieved.  -  Diverticulosis in the sigmoid colon and in the descending colon.  - Internal hemorrhoids.  - The examination was otherwise normal on direct and retroflexion views.  - Personal history of colonic polyps.  05/19/2003 - adenomas  08/2010 - 2 diminutive adenomas  09/09/2015 - no polyps - Recall 5 years Path: Surgical [P], colon, transverse, ascending and sigmoid, polyp (3) - TUBULAR ADENOMA WITHOUT HIGH-GRADE DYSPLASIA OR MALIGNANCY - DIMINUTIVE HYPERPLASTIC POLYP - OTHER FRAGMENT OF POLYPOID COLONIC MUCOSA WITH PROMINENT LYMPHOID AGGREGATE  Past Medical History:  Diagnosis Date   Abnormal chest x-ray    Cancer (HCC)    squamous cell skin cancer    Diverticulosis of colon    DJD (degenerative joint disease)    fingers    Elevated prostate specific antigen (PSA)    GERD (gastroesophageal reflux disease)    past hx    Hx of colonic polyps    Hyperlipidemia    with low hdl   Meningioma (HCC)    Other chest pain    Sinusitis     Past Surgical History:  Procedure Laterality Date   COLONOSCOPY     ENT surgery for sinus retention cyst  2010   at [facility] SURGERY  05/2020   POLYPECTOMY     prostate biopsies  2011   x 2 at [facility]   REFRACTIVE SURGERY     right [facility]  2003   Dr. Anderson hip arthroplasty   TONSILLECTOMY AND ADENOIDECTOMY      Current Outpatient Medications  Medication Sig Dispense Refill   ascorbic acid (VITAMIN C) 1000 MG tablet Vitamin C 1,000 mg tablet  Take 1 tablet every day by oral route.     Cholecalciferol 25 MCG (1000  UT) tablet cholecalciferol (vitamin D3) 25 mcg (1,000 unit) tablet  Take 3 tablets every day by oral route.     fenofibrate  54 MG tablet fenofibrate  54 mg tablet     Ginseng 100 MG CAPS Take by mouth in the morning and at bedtime.     Methylsulfonylmethane (MSM) 1500 MG TABS Take by mouth.     Multiple Vitamin (MULTIVITAMIN) capsule Take 2 capsules by mouth daily.      NONFORMULARY OR COMPOUNDED ITEM NMN daily 500 mg     Nutritional  Supplements (MELATONIN PO) Take 6 mg by mouth daily.     Omega-3 Fatty Acids (FISH OIL) 1000 MG CAPS Take by mouth.     OVER THE COUNTER MEDICATION Natures Sunshine Food Enzyme 1 tablet BID     SUPER B COMPLEX/C PO Super B Complex  1 by mouth twice daily     No current facility-administered medications for this visit.    Allergies as of 10/20/2024 - Review Complete 10/20/2024  Allergen Reaction Noted   Penicillins     Wheat Other (See Comments) 09/15/2015    Family History  Problem Relation Age of Onset   Colon cancer Neg Hx    Colon polyps Neg Hx    Esophageal cancer Neg Hx    Rectal cancer Neg Hx    Stomach cancer Neg Hx     Social History[1]   Review of Systems:    Constitutional: No fever, chills Cardiovascular: No chest pain Respiratory: No SOB  Gastrointestinal: See HPI and otherwise negative   Physical Exam:  Vital signs: BP 120/70   Pulse 75   Ht 5' 8 (1.727 m)   Wt 193 lb (87.5 kg)   BMI 29.35 kg/m   Constitutional: Pleasant, well-appearing male in NAD, alert and cooperative Head:  Normocephalic and atraumatic.  Respiratory: Respirations even and unlabored. Lungs clear to auscultation bilaterally.  No wheezes, crackles, or rhonchi.  Cardiovascular:  Regular rate and rhythm. No murmurs. No peripheral edema. Gastrointestinal:  Soft, nondistended, nontender. No rebound or guarding. Normal bowel sounds. No appreciable masses or hepatomegaly. Rectal: Nonbleeding, nonthrombosed external hemorrhoids versus perianal skin tags on external exam.  No fissures.  Nonbleeding internal hemorrhoids on anoscopy.  Chaperone present for exam. Neurologic:  Alert and oriented x4;  grossly normal neurologically.  Skin:   Dry and intact without significant lesions or rashes. Psychiatric: Oriented to person, place and time. Demonstrates good judgement and reason without abnormal affect or behaviors.   RELEVANT LABS AND IMAGING: CBC    Component Value Date/Time   WBC 6.9  02/12/2013 1509   RBC 4.80 02/12/2013 1509   HGB 15.4 02/12/2013 1509   HCT 44.0 02/12/2013 1509   PLT 181.0 02/12/2013 1509   MCV 91.7 02/12/2013 1509   MCHC 35.0 02/12/2013 1509   RDW 13.1 02/12/2013 1509   LYMPHSABS 1.8 02/12/2013 1509   MONOABS 0.7 02/12/2013 1509   EOSABS 0.3 02/12/2013 1509   BASOSABS 0.1 02/12/2013 1509    CMP     Component Value Date/Time   NA 141 02/12/2013 1509   K 4.1 02/12/2013 1509   CL 107 02/12/2013 1509   CO2 26 02/12/2013 1509   GLUCOSE 95 02/12/2013 1509   BUN 20 02/12/2013 1509   CREATININE 1.1 02/12/2013 1509   CALCIUM 9.1 02/12/2013 1509   PROT 6.9 02/12/2013 1509   ALBUMIN 4.0 02/12/2013 1509   AST 27 02/12/2013 1509   ALT 32 02/12/2013 1509   ALKPHOS 58 02/12/2013 1509  BILITOT 0.7 02/12/2013 1509   GFRNONAA 59.92 10/13/2009 0926   GFRAA 80 03/24/2008 0000     Assessment/Plan:   Assessment & Plan Hemorrhoids History of colon polyps Internal hemorrhoids detected 10-12 years ago, typically asymptomatic with occasional irritation causing external protrusion and mild bleeding. Reducible and suitable for banding. No significant bleeding or bowel habit changes. Tolerated anoscopy. Last colonoscopy 2022.  History of adenomatous colon polyps and will be due for repeat colonoscopy in 2027.  Recall placed.  - Schedule hemorrhoid banding with Dr. Avram - Colonoscopy due 2027   Camie Furbish, PA-C [facility] 10/20/2024, 2:06 PM  Patient Care Team: Patient, No Pcp Per as PCP - General (General Practice) Verna Becker, MD as Referring Physician       [1]  Social History Tobacco Use   Smoking status: Former    Current packs/day: 0.00    Average packs/day: 2.0 packs/day for 10.0 years (20.0 ttl pk-yrs)    Types: Cigarettes    Start date: 11/05/1965    Quit date: 11/06/1975    Years since quitting: 48.9   Smokeless tobacco: Never  Substance Use Topics   Alcohol use: Yes    Alcohol/week: 1.0 standard drink of  alcohol    Types: 1 Glasses of wine per week   Drug use: No

## 2024-10-20 NOTE — Patient Instructions (Signed)
 You have been scheduled for your Hemorrhoid Banding Appointments with Dr Avram. Please see after visit summary for your appointments.   _______________________________________________________  If your blood pressure at your visit was 140/90 or greater, please contact your primary care physician to follow up on this.  _______________________________________________________  If you are age 74 or older, your body mass index should be between 23-30. Your Body mass index is 29.35 kg/m. If this is out of the aforementioned range listed, please consider follow up with your Primary Care Provider.  If you are age 18 or younger, your body mass index should be between 19-25. Your Body mass index is 29.35 kg/m. If this is out of the aformentioned range listed, please consider follow up with your Primary Care Provider.   ________________________________________________________  The [facility] providers would like to encourage you to use MYCHART to communicate with providers for non-urgent requests or questions.  Due to long hold times on the telephone, sending your provider a message by [facility] may be a faster and more efficient way to get a response.  Please allow 48 business hours for a response.  Please remember that this is for non-urgent requests.  _______________________________________________________  [facility] is using a team-based approach to care.  Your team is made up of your doctor and two to three APPS. Our APPS (Nurse Practitioners and Physician Assistants) work with your physician to ensure care continuity for you. They are fully qualified to address your health concerns and develop a treatment plan. They communicate directly with your gastroenterologist to care for you. Seeing the Advanced Practice Practitioners on your physician's team can help you by facilitating care more promptly, often allowing for earlier appointments, access to diagnostic testing, procedures, and other  specialty referrals.   Thank you for choosing me and [facility].  Camie Furbish, PA-C

## 2024-11-23 ENCOUNTER — Encounter: Admitting: Internal Medicine

## 2024-12-22 ENCOUNTER — Encounter: Admitting: Internal Medicine

## 2025-01-14 ENCOUNTER — Encounter: Admitting: Internal Medicine
